# Patient Record
Sex: Female | Born: 1940
Health system: Southern US, Community
[De-identification: ages and names within clinical notes are randomized; demographics above are authoritative.]

## PROBLEM LIST (undated history)

## (undated) DIAGNOSIS — R112 Nausea with vomiting, unspecified: Secondary | ICD-10-CM

## (undated) DIAGNOSIS — I10 Essential (primary) hypertension: Secondary | ICD-10-CM

## (undated) DIAGNOSIS — Z9889 Other specified postprocedural states: Secondary | ICD-10-CM

## (undated) DIAGNOSIS — T4145XA Adverse effect of unspecified anesthetic, initial encounter: Secondary | ICD-10-CM

## (undated) DIAGNOSIS — T8859XA Other complications of anesthesia, initial encounter: Secondary | ICD-10-CM

## (undated) DIAGNOSIS — E669 Obesity, unspecified: Secondary | ICD-10-CM

## (undated) DIAGNOSIS — Z8041 Family history of malignant neoplasm of ovary: Secondary | ICD-10-CM

## (undated) DIAGNOSIS — E039 Hypothyroidism, unspecified: Secondary | ICD-10-CM

## (undated) DIAGNOSIS — Z803 Family history of malignant neoplasm of breast: Secondary | ICD-10-CM

## (undated) DIAGNOSIS — R002 Palpitations: Secondary | ICD-10-CM

## (undated) DIAGNOSIS — I679 Cerebrovascular disease, unspecified: Secondary | ICD-10-CM

## (undated) DIAGNOSIS — C801 Malignant (primary) neoplasm, unspecified: Secondary | ICD-10-CM

## (undated) DIAGNOSIS — K219 Gastro-esophageal reflux disease without esophagitis: Secondary | ICD-10-CM

## (undated) HISTORY — DX: Palpitations: R00.2

## (undated) HISTORY — DX: Essential (primary) hypertension: I10

## (undated) HISTORY — DX: Family history of malignant neoplasm of breast: Z80.3

## (undated) HISTORY — PX: ADENOIDECTOMY: SUR15

## (undated) HISTORY — DX: Family history of malignant neoplasm of ovary: Z80.41

## (undated) HISTORY — DX: Cerebrovascular disease, unspecified: I67.9

## (undated) HISTORY — PX: BLADDER SURGERY: SHX569

## (undated) HISTORY — PX: ROTATOR CUFF REPAIR: SHX139

## (undated) HISTORY — DX: Hypothyroidism, unspecified: E03.9

## (undated) HISTORY — PX: CHOLECYSTECTOMY: SHX55

## (undated) HISTORY — PX: TONSILLECTOMY: SUR1361

## (undated) HISTORY — DX: Obesity, unspecified: E66.9

---
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
## 1998-07-05 ENCOUNTER — Ambulatory Visit (HOSPITAL_BASED_OUTPATIENT_CLINIC_OR_DEPARTMENT_OTHER): Admission: RE | Admit: 1998-07-05 | Discharge: 1998-07-05 | Payer: Self-pay | Admitting: Orthopedic Surgery

## 1999-07-04 ENCOUNTER — Encounter: Admission: RE | Admit: 1999-07-04 | Discharge: 1999-07-04 | Payer: Self-pay | Admitting: *Deleted

## 1999-08-24 ENCOUNTER — Encounter: Payer: Self-pay | Admitting: Neurosurgery

## 1999-08-24 ENCOUNTER — Ambulatory Visit (HOSPITAL_COMMUNITY): Admission: RE | Admit: 1999-08-24 | Discharge: 1999-08-24 | Payer: Self-pay | Admitting: Neurosurgery

## 1999-08-27 ENCOUNTER — Encounter: Payer: Self-pay | Admitting: Neurosurgery

## 1999-08-27 ENCOUNTER — Ambulatory Visit (HOSPITAL_COMMUNITY): Admission: RE | Admit: 1999-08-27 | Discharge: 1999-08-27 | Payer: Self-pay | Admitting: Neurosurgery

## 1999-09-12 ENCOUNTER — Encounter: Admission: RE | Admit: 1999-09-12 | Discharge: 1999-09-12 | Payer: Self-pay | Admitting: *Deleted

## 1999-10-11 ENCOUNTER — Ambulatory Visit (HOSPITAL_COMMUNITY): Admission: RE | Admit: 1999-10-11 | Discharge: 1999-10-11 | Payer: Self-pay | Admitting: Neurology

## 1999-10-11 ENCOUNTER — Encounter: Payer: Self-pay | Admitting: Neurology

## 2000-03-01 ENCOUNTER — Ambulatory Visit (HOSPITAL_COMMUNITY): Admission: RE | Admit: 2000-03-01 | Discharge: 2000-03-01 | Payer: Self-pay | Admitting: *Deleted

## 2001-07-11 ENCOUNTER — Encounter: Payer: Self-pay | Admitting: Internal Medicine

## 2001-07-11 ENCOUNTER — Encounter: Admission: RE | Admit: 2001-07-11 | Discharge: 2001-07-11 | Payer: Self-pay | Admitting: Internal Medicine

## 2003-04-17 ENCOUNTER — Ambulatory Visit (HOSPITAL_COMMUNITY): Admission: RE | Admit: 2003-04-17 | Discharge: 2003-04-17 | Payer: Self-pay | Admitting: *Deleted

## 2003-04-17 ENCOUNTER — Encounter (INDEPENDENT_AMBULATORY_CARE_PROVIDER_SITE_OTHER): Payer: Self-pay | Admitting: Specialist

## 2003-04-20 ENCOUNTER — Ambulatory Visit (HOSPITAL_COMMUNITY): Admission: RE | Admit: 2003-04-20 | Discharge: 2003-04-20 | Payer: Self-pay | Admitting: Internal Medicine

## 2003-06-19 ENCOUNTER — Encounter (INDEPENDENT_AMBULATORY_CARE_PROVIDER_SITE_OTHER): Payer: Self-pay | Admitting: Specialist

## 2003-06-19 ENCOUNTER — Ambulatory Visit (HOSPITAL_COMMUNITY): Admission: RE | Admit: 2003-06-19 | Discharge: 2003-06-19 | Payer: Self-pay | Admitting: *Deleted

## 2004-01-28 ENCOUNTER — Ambulatory Visit (HOSPITAL_COMMUNITY): Admission: RE | Admit: 2004-01-28 | Discharge: 2004-01-28 | Payer: Self-pay | Admitting: Cardiology

## 2006-08-30 ENCOUNTER — Encounter: Admission: RE | Admit: 2006-08-30 | Discharge: 2006-08-30 | Payer: Self-pay | Admitting: Internal Medicine

## 2006-08-30 ENCOUNTER — Inpatient Hospital Stay (HOSPITAL_COMMUNITY): Admission: EM | Admit: 2006-08-30 | Discharge: 2006-09-02 | Payer: Self-pay | Admitting: *Deleted

## 2006-12-04 ENCOUNTER — Encounter (INDEPENDENT_AMBULATORY_CARE_PROVIDER_SITE_OTHER): Payer: Self-pay | Admitting: Plastic Surgery

## 2006-12-04 ENCOUNTER — Ambulatory Visit (HOSPITAL_BASED_OUTPATIENT_CLINIC_OR_DEPARTMENT_OTHER): Admission: RE | Admit: 2006-12-04 | Discharge: 2006-12-04 | Payer: Self-pay | Admitting: Plastic Surgery

## 2009-06-02 ENCOUNTER — Encounter: Payer: Self-pay | Admitting: Cardiovascular Disease

## 2010-01-18 ENCOUNTER — Encounter: Payer: Self-pay | Admitting: Cardiovascular Disease

## 2010-01-18 ENCOUNTER — Ambulatory Visit: Payer: Self-pay

## 2010-01-18 ENCOUNTER — Encounter: Admission: RE | Admit: 2010-01-18 | Discharge: 2010-01-18 | Payer: Self-pay | Admitting: Cardiology

## 2010-01-18 ENCOUNTER — Ambulatory Visit: Payer: Self-pay | Admitting: Cardiology

## 2010-01-18 DIAGNOSIS — R42 Dizziness and giddiness: Secondary | ICD-10-CM | POA: Insufficient documentation

## 2010-01-19 ENCOUNTER — Ambulatory Visit: Payer: Self-pay | Admitting: Cardiology

## 2010-01-19 ENCOUNTER — Ambulatory Visit (HOSPITAL_COMMUNITY): Admission: RE | Admit: 2010-01-19 | Discharge: 2010-01-19 | Payer: Self-pay | Admitting: Cardiology

## 2010-01-20 ENCOUNTER — Encounter: Admission: RE | Admit: 2010-01-20 | Discharge: 2010-01-20 | Payer: Self-pay | Admitting: Cardiology

## 2010-01-25 ENCOUNTER — Encounter: Payer: Self-pay | Admitting: Internal Medicine

## 2010-01-25 ENCOUNTER — Ambulatory Visit: Payer: Self-pay

## 2010-01-25 ENCOUNTER — Ambulatory Visit: Payer: Self-pay | Admitting: Internal Medicine

## 2010-01-25 ENCOUNTER — Encounter (HOSPITAL_COMMUNITY): Admission: RE | Admit: 2010-01-25 | Discharge: 2010-03-09 | Payer: Self-pay | Admitting: Cardiology

## 2010-01-28 ENCOUNTER — Ambulatory Visit: Payer: Self-pay | Admitting: Cardiology

## 2010-02-02 ENCOUNTER — Ambulatory Visit: Payer: Self-pay | Admitting: Cardiology

## 2010-06-07 ENCOUNTER — Encounter
Admission: RE | Admit: 2010-06-07 | Discharge: 2010-06-07 | Payer: Self-pay | Source: Home / Self Care | Attending: Internal Medicine | Admitting: Internal Medicine

## 2010-07-03 ENCOUNTER — Encounter: Payer: Self-pay | Admitting: Internal Medicine

## 2010-07-03 ENCOUNTER — Encounter: Payer: Self-pay | Admitting: Radiology

## 2010-07-14 NOTE — Miscellaneous (Signed)
Summary: Orders Update  Clinical Lists Changes  Problems: Added new problem of DIZZINESS (ICD-780.4) Orders: Added new Test order of Carotid Duplex (Carotid Duplex) - Signed 

## 2010-07-14 NOTE — Assessment & Plan Note (Signed)
Summary: Cardiology Nuclear Testing  Nuclear Med Background Indications for Stress Test: Evaluation for Ischemia, Abnormal EKG  Indications Comments: .  History: Myocardial Perfusion Study  History Comments:  ~'06 [id number] per patient  Symptoms: Chest Pressure with Exertion, Near Syncope, Palpitations, Rapid HR  Symptoms Comments: Last episode CP:2 weeks ago.    Nuclear Pre-Procedure Cardiac Risk Factors: Hypertension Caffeine/Decaff Intake: none NPO After: 7:00 PM Lungs: Clear IV 0.9% NS with Angio Cath: 22g     IV Site: R hand IV Started by: Darrick Penna Cup Size 38C     Height (in): 67 Weight (lb): 151 BMI: 23.74 Tech Comments: Atenolol held x 12 hours  Nuclear Med Study 1 or 2 day study:  1 day     Stress Test Type:  Stress Reading MD:  Arvilla Meres, MD     Referring MD:  Roger Shelter, MD Resting Radionuclide:  Technetium 53m Tetrofosmin     Resting Radionuclide Dose:  10 mCi  Stress Radionuclide:  Technetium 78m Tetrofosmin     Stress Radionuclide Dose:  33 mCi   Stress Protocol Exercise Time (min):  8:30 min     Max HR:  148 bpm     Predicted Max HR:  153 bpm  Max Systolic BP: 174 mm Hg     Percent Max HR:  96.73 %     METS: 10.2 Rate Pressure Product:  19147    Stress Test Technologist:  Rea [employer] CMA-N     Nuclear Technologist:  Domenic Polite CNMT  Rest Procedure  Myocardial perfusion imaging was performed at rest 45 minutes following the intravenous administration of Myoview Technetium 36m Tetrofosmin.  Stress Procedure  The patient exercised for 8:30.  The patient stopped due to fatigue.  She did c/o chest pressure, 2/10, with exercise.  There were no diagnostic ST-T wave changes.  Myoview was injected at peak exercise and myocardial perfusion imaging was performed after a brief delay.  QPS Raw Data Images:  There is interference from nuclear activity from structures below the diaphragm.  This does not affect the ability to read the  study. Stress Images:  There is normal uptake in all areas. Rest Images:  Normal homogeneous uptake in all areas of the myocardium. Subtraction (SDS):  Normal Transient Ischemic Dilatation:  1.02  (Normal <1.22)  Lung/Heart Ratio:  .27  (Normal <0.45)  Quantitative Gated Spect Images QGS EDV:  62 ml QGS ESV:  15 ml QGS EF:  76 % QGS cine images:  Normal  Findings Normal nuclear study      Overall Impression  Exercise Capacity: Good exercise capacity. BP Response: Normal blood pressure response. Clinical Symptoms: Chest pressure (2/10). Fatigue. ECG Impression: No significant ST segment change suggestive of ischemia. Overall Impression: Normal stress nuclear study.

## 2010-10-25 NOTE — Op Note (Signed)
Faith Mcmahon, Faith Mcmahon             ACCOUNT NO.:  [id number]   MEDICAL RECORD NO.:  [id number]          PATIENT TYPE:  AMB   LOCATION:  DSC                          FACILITY:  [facility]   PHYSICIAN:  Brantley Persons, M.D.DATE OF BIRTH:  May 03, 1941   DATE OF PROCEDURE:  12/04/2006  DATE OF DISCHARGE:  12/04/2006                               OPERATIVE REPORT   PREOPERATIVE DIAGNOSIS:  Basal cell cancer, right forehead.   POSTOPERATIVE DIAGNOSIS:  Basal cell cancer, right forehead.   PROCEDURE:  1. Wide local excision of 0.7-cm basal cell cancer, right forehead,      with intraoperative frozen section diagnosis.  2. Complex closure of 1.7-cm right forehead incision.   ATTENDING SURGEON:  Eloise Levels, M.D.   ANESTHESIA:  1% lidocaine with epinephrine.   COMPLICATIONS:  None.   INDICATIONS FOR PROCEDURE:  The patient is a 70 year old Caucasian  female who has a biopsy-proven basal cell cancer of the right forehead.  She has been referred by Dr. Campbell Stall for a wide local excision of  this lesion.  We will proceed with that in the minor room of the  surgical center and that have an intraoperative frozen section diagnosis  performed.  I will be on standby while we await the pathology report.   PROCEDURE:  The patient was brought to the minor surgery room and placed  on the table in supine position.  The right forehead and face were  prepped with Betadine and draped in a sterile fashion.  The skin and  subcutaneous tissues in the area of the healing biopsy site and residual  skin cancer were then injected with 1% lidocaine with epinephrine.  After adequate hemostasis and anesthesia had taken effect, the procedure  was begun.   Using loupe magnification, the borders of the healing biopsy site and  any residual basal cell cancer were identified.  Two-millimeter margins  were marked circumferentially around this.  The skin lesion was excised  full-thickness through the skin  into the subcutaneous tissues.  The  specimen marked at the 12 o'clock position with a silk suture and was  passed off the table to undergo an intraoperative frozen section  diagnosis.  While I was on standby, Dr. Berneta Levins, the pathologist,  reviewed the specimen and felt that all of the basal cell cancer had  been removed and that the surgical margins were clear.  The total length  of the excised specimen was 0.7 cm.  I then proceeded with closure of  the incision.  The skin edges and subcutaneous tissues were undermined  for better closure.  A complex closure then ensued.  The deep  subcutaneous tissues and dermal layer were closed with 4-0 Monocryl  suture.  The superficial dermal layer was then also closed with 4-0  Monocryl suture.  The skin was then closed with a 6-0  Prolene in a running baseball-type stitch.  There were no complications.  The patient tolerated the procedure well.  The incision was then dressed  with bacitracin ointment and the patient was given appropriate discharge  instructions.  She was then discharged  home in stable condition.  Followup appointment be tomorrow in the office.           ______________________________  Brantley Persons, M.D.     MC/MEDQ  D:  12/05/2006  T:  12/06/2006  Job:  [id number]

## 2010-10-28 NOTE — H&P (Signed)
Faith Mcmahon, Faith Mcmahon             ACCOUNT NO.:  [id number]   MEDICAL RECORD NO.:  [id number]          PATIENT TYPE:  INP   LOCATION:  5735                         FACILITY:  [facility]   PHYSICIAN:  Wilmon Arms. Corliss Skains, M.D. DATE OF BIRTH:  08/23/1940   DATE OF ADMISSION:  08/30/2006  DATE OF DISCHARGE:                              HISTORY & PHYSICAL   CHIEF COMPLAINT:  Left lower quadrant pain and nausea.   HISTORY OF PRESENT ILLNESS:  The patient is a 70 year old female in  relatively good health, who presents with a week of nausea and vomiting  accompanied by some diarrhea.  She was being treated with ciprofloxacin  on an outpatient basis for presumed diagnosis of food poisoning.  The  patient feels that the Cipro is causing a significant portion of her  nausea and has quit taking it.  Her vomiting has improved, but she still  has some nausea.  On August 29, 2006, she developed some left lower  quadrant abdominal pain.  She remains afebrile.  Overall, she feels very  poorly and went to see Dr. [name].  Apparently, he ordered a CT scan,  which showed some mild sigmoid diverticulitis, but there was a question  of dot of extraluminal air.  Dr. [name] told the patient to come to the  emergency department.  The patient denies any melena, hematochezia or  hematemesis.   PAST MEDICAL HISTORY:  1. Hypertension.  2. Peptic ulcer disease.   PAST SURGICAL HISTORY:  1. Laparoscopic cholecystectomy by Dr. Ezzard Standing.  2. Bilateral tubal ligation and hysterectomy.   ALLERGIES:  PENICILLIN and CIPRO, which the patient feels is causing  severe nausea.  The patient has no true allergy to CIPRO.   MEDICATIONS:  1. Atenolol/hydrochlorothiazide.  2. Premarin.  3. Prozac.  4. Multivitamin.  5. Caltrate.   SOCIAL HISTORY:  Nonsmoker, nondrinker.   PHYSICAL EXAMINATION:  VITAL SIGNS:  Temperature 97.5, pulse 78, blood  pressure 147/76, respirations 18.  GENERAL:  This is a well-developed,  well-nourished female in no apparent  distress.  HEENT:  EOMI.  Sclerae anicteric.  NECK:  No masses.  No thyromegaly.  LUNGS;  Clear.  Normal respiratory effort.  HEART:  Regular rate and rhythm.  No murmur.  ABDOMEN:  Soft.  Hypoactive bowel sounds.  The patient has some left  lower quadrant tenderness, no rebound, no guarding.   LABORATORY DATA:  White count 12.4, hemoglobin 14.2.  Electrolytes:  Sodium 133, potassium 4.2, chloride 101, bicarb 29.5, BUN 10, creatinine  0.74, total bilirubin 1.4, direct bilirubin 0.5.   IMPRESSION:  Sigmoid diverticulitis, first episode.   PLAN:  Bowel rest, IV hydration, IV antibiotics.  Due to her penicillin  allergy and her sensitivity to Cipro, we will use Tygacil.      Wilmon Arms. Tsuei, M.D.  Electronically Signed     MKT/MEDQ  D:  08/31/2006  T:  08/31/2006  Job:  [id number]

## 2010-10-28 NOTE — Discharge Summary (Signed)
NAMEKIONDRA, CAICEDO             ACCOUNT NO.:  [id number]   MEDICAL RECORD NO.:  [id number]          PATIENT TYPE:  INP   LOCATION:  5735                         FACILITY:  [facility]   PHYSICIAN:  Maisie Fus A. Cornett, M.D.DATE OF BIRTH:  06/15/1940   DATE OF ADMISSION:  08/30/2006  DATE OF DISCHARGE:  09/02/2006                               DISCHARGE SUMMARY   ADMITTING DIAGNOSIS:  Acute diverticulitis.   DISCHARGE DIAGNOSIS:  Acute diverticulitis.   PROCEDURES PERFORMED:  None.   BRIEF HISTORY:  The patient is a 70 year old female admitted on the 20th  with acute diverticulitis.  She was placed on bowel rest, IV fluids and  IV Tygacil.   HOSPITAL COURSE:  The patient's hospital course was unremarkable.  By  postop day one, her pain was almost completely gone.  I advanced her  diet to a soft diet by postop day two and then she was discharged postop  day three with no pain whatsoever, tolerating a soft diet and moving her  bowels.  Her white count was normal.  I switched her over from Tygacil  to clarithromycin 500 mg p.o. b.i.d. and Flagyl for seven more days.  SHE HAD A CIPRO AND PENICILLIN ALLERGY.   DISCHARGE INSTRUCTIONS:  She will follow up with her medical doctor in  ten days to two weeks.  She will go on atenolol 50 mg daily,  hydrochlorothiazide 12.5 mg daily, conjugated estrogen 0.625 mg daily,  clarithromycin 500 mg b.i.d. and Flagyl 500 mg b.i.d. and Prilosec over-  the-counter 20 mg daily.   CONDITION AT DISCHARGE:  Improved.      Thomas A. Cornett, M.D.  Electronically Signed     TAC/MEDQ  D:  09/02/2006  T:  09/02/2006  Job:  [id number]   cc:   Soyla Murphy. [name], M.D.

## 2010-10-28 NOTE — Op Note (Signed)
NAMEMarland Mcmahon  HARRIET, SUTPHEN                       ACCOUNT NO.:  [id number]   MEDICAL RECORD NO.:  [id number]                   PATIENT TYPE:  AMB   LOCATION:  ENDO                                 FACILITY:  [facility]   PHYSICIAN:  Georgiana Spinner, M.D.                 DATE OF BIRTH:  1940-07-12   DATE OF PROCEDURE:  DATE OF DISCHARGE:                                 OPERATIVE REPORT   PROCEDURE:  Upper endoscopy.   INDICATIONS:  Gastric ulcer.   ANESTHESIA:  Demerol 60 mg, Versed 8 mg.   DESCRIPTION OF PROCEDURE:  With the patient mildly sedated in the left  lateral decubitus position, the Olympus videoscopic endoscope was inserted  in the mouth and passed under direct vision through the esophagus, which  appeared normal except for one area of possible Barrett's which was seen,  photographed and biopsied.  We entered into the stomach.  The fundus, body,  antrum, duodenal bulb, and second portion of the duodenum all appeared  normal.  From this point, the endoscope was slowly withdrawn taking  circumferential views of the duodenal mucosa until the endoscope then pulled  back into the stomach, placed in retroflexion and viewed the stomach from  below.  The endoscope was then straightened, withdrawn, taking  circumferential views of the remaining gastric and esophageal mucosa.  The  patient's vital signs and pulse oximetry remained stable.  The patient  tolerated the procedure well without apparent complication.   FINDINGS:  Incomplete wrap of the GE junction around the endoscope with  question of Barrett's esophagus proximal.  Biopsy.  Await biopsy report.  Otherwise an unremarkable exam.  The patient will call me for results and  follow up with me as an outpatient.                                               Georgiana Spinner, M.D.    GMO/MEDQ  D:  06/19/2003  T:  06/19/2003  Job:  [id number]

## 2010-10-28 NOTE — Procedures (Signed)
[facility]  Patient:    Faith Mcmahon, Faith Mcmahon                    MRN: [id number] Proc. Date: 03/01/00 Adm. Date:  [id number] Attending:  Sabino Gasser                           Procedure Report  PROCEDURE:  Colonoscopy.  SURGEON:  Sabino Gasser, M.D.  ANESTHESIA:  Demerol 80 mg and Versed 8 mg.  INDICATIONS:  Family history of colon polyps.  DESCRIPTION OF PROCEDURE:  With the patient mildly sedated in the left lateral decubitus position, and subsequently with pressure applied to the left lower quadrant of the abdomen, the Olympus videoscopic pediatric colonoscope was inserted in the rectum and passed under direct vision to the cecum.  The cecum was identified by the ileocecal valve and appendiceal orifice, both of which were photographed.  From this point, the colonoscope was slowly withdrawn, taking circumferential views of the entire colonic mucosa, stopping only in the sigmoid colon which showed diverticula, which were photographed.  In the rectum, the endoscope was placed in retroflexion to view the anal canal from above.  An internal hemorrhoid was seen and photographed, both in this view and on a pull through after we straightened the endoscope.  The patients vital signs and pulse oximeter remained stable.  The patient tolerated the procedure well and without apparent complications.  FINDINGS:  Hemorrhoid and diverticuli of sigmoid colon, otherwise unremarkable examination.  PLAN:  Repeat examination in five years. DD:  03/01/00 TD:  03/02/00 Job: [id number]

## 2010-10-28 NOTE — Op Note (Signed)
   NAME:  Faith Mcmahon, Faith Mcmahon                       ACCOUNT NO.:  [id number]   MEDICAL RECORD NO.:  [id number]                   PATIENT TYPE:  AMB   LOCATION:  ENDO                                 FACILITY:  [facility]   PHYSICIAN:  Georgiana Spinner, M.D.                 DATE OF BIRTH:  03/16/1941   DATE OF PROCEDURE:  DATE OF DISCHARGE:                                 OPERATIVE REPORT   PROCEDURE:  Upper endoscopy with biopsy.   INDICATIONS FOR PROCEDURE:  Abdominal discomfort, nausea.   ANESTHESIA:  Demerol 70, Versed 8 mg.   DESCRIPTION OF PROCEDURE:  With the patient mildly sedated in the left  lateral decubitus position, the Olympus videoscopic endoscope was inserted  in the mouth, passed under direct vision through the esophagus which  appeared normal until we reached the distal esophagus and there was a  question of Barrett's photographed and biopsied. We entered into the stomach  and some blood flecks were seen in the stomach. We advanced through the  pylorus into the duodenal bulb and ulcerations were seen throughout the bulb  and also into the second portion of the duodenum small ulcerations were  seen. On the posterior wall of the duodenum, a larger ulcer with a black  spot was seen, none were actively bleeding. The endoscope was then pulled  back all the way to the stomach and biopsies of erosions in the stomach were  taken. The endoscope was placed in retroflexion to view the stomach from  below. The endoscope was straightened and withdrawn taking circumferential  views of the remaining gastric and esophageal mucosa. The patient's vital  signs and pulse oximeter remained stable. The patient tolerated the  procedure well without apparent complications.   FINDINGS:  Multiple ulcers of duodenum and of the stomach especially in the  antrum, await biopsy reports. The patient will call me for results and  followup with me as an outpatient. Will start the patient on PPI therapy,  get  a serum gastrin level and have the patient avoid NSAIDs.                                               Georgiana Spinner, M.D.    GMO/MEDQ  D:  04/17/2003  T:  04/17/2003  Job:  [id number]

## 2010-12-13 ENCOUNTER — Other Ambulatory Visit: Payer: Self-pay | Admitting: Internal Medicine

## 2010-12-13 DIAGNOSIS — R1011 Right upper quadrant pain: Secondary | ICD-10-CM

## 2010-12-13 DIAGNOSIS — R160 Hepatomegaly, not elsewhere classified: Secondary | ICD-10-CM

## 2010-12-15 ENCOUNTER — Ambulatory Visit
Admission: RE | Admit: 2010-12-15 | Discharge: 2010-12-15 | Disposition: A | Discharge status: Home / Self Care | Payer: Medicare Other | Source: Ambulatory Visit | Attending: Internal Medicine | Admitting: Internal Medicine

## 2010-12-15 DIAGNOSIS — R160 Hepatomegaly, not elsewhere classified: Secondary | ICD-10-CM

## 2010-12-15 DIAGNOSIS — R1011 Right upper quadrant pain: Secondary | ICD-10-CM

## 2010-12-15 MED ORDER — IOHEXOL 300 MG/ML  SOLN
100.0000 mL | Freq: Once | INTRAMUSCULAR | Status: AC | PRN
  Administered 2010-12-15: 100 mL via INTRAVENOUS

## 2011-01-11 ENCOUNTER — Encounter: Payer: Self-pay | Admitting: Cardiology

## 2011-01-25 ENCOUNTER — Encounter: Payer: Self-pay | Admitting: Cardiology

## 2011-06-26 ENCOUNTER — Other Ambulatory Visit: Payer: Self-pay | Admitting: Internal Medicine

## 2011-06-26 DIAGNOSIS — E041 Nontoxic single thyroid nodule: Secondary | ICD-10-CM

## 2011-06-28 ENCOUNTER — Ambulatory Visit
Admission: RE | Admit: 2011-06-28 | Discharge: 2011-06-28 | Disposition: A | Discharge status: Home / Self Care | Payer: Medicare Other | Source: Ambulatory Visit | Attending: Internal Medicine | Admitting: Internal Medicine

## 2011-06-28 DIAGNOSIS — E041 Nontoxic single thyroid nodule: Secondary | ICD-10-CM

## 2011-08-15 ENCOUNTER — Encounter: Payer: Self-pay | Admitting: Cardiology

## 2011-08-15 ENCOUNTER — Encounter: Payer: Self-pay | Admitting: *Deleted

## 2011-08-17 ENCOUNTER — Ambulatory Visit (INDEPENDENT_AMBULATORY_CARE_PROVIDER_SITE_OTHER): Payer: Medicare Other | Admitting: Cardiology

## 2011-08-17 ENCOUNTER — Encounter: Payer: Self-pay | Admitting: Cardiology

## 2011-08-17 DIAGNOSIS — I6529 Occlusion and stenosis of unspecified carotid artery: Secondary | ICD-10-CM

## 2011-08-17 DIAGNOSIS — I10 Essential (primary) hypertension: Secondary | ICD-10-CM | POA: Insufficient documentation

## 2011-08-17 DIAGNOSIS — I679 Cerebrovascular disease, unspecified: Secondary | ICD-10-CM

## 2011-08-17 DIAGNOSIS — E785 Hyperlipidemia, unspecified: Secondary | ICD-10-CM | POA: Insufficient documentation

## 2011-08-17 NOTE — Patient Instructions (Signed)
Your physician wants you to follow-up in: ONE YEAR You will receive a reminder letter in the mail two months in advance. If you don't receive a letter, please call our office to schedule the follow-up appointment.   Your physician has requested that you have a carotid duplex. This test is an ultrasound of the carotid arteries in your neck. It looks at blood flow through these arteries that supply the brain with blood. Allow one hour for this exam. There are no restrictions or special instructions.   

## 2011-08-17 NOTE — Assessment & Plan Note (Signed)
Continue statin. Lipids and liver monitored by primary care. 

## 2011-08-17 NOTE — Progress Notes (Signed)
HPI: 71 year old female previously followed by Dr. Deborah Chalk for fu of hypertension. Stress Myoview in January of 2011 showed normal perfusion and an ejection fraction of 76%. Echocardiogram in August of 2011 showed normal LV systolic function, mild left ventricular hypertrophy with diastolic dysfunction and mild tricuspid regurgitation. There was trace mitral regurgitation. Carotid Dopplers in December of 2010 showed severe stenosis in the left external carotid and mild to moderate stenosis in the right external carotid. The internal carotid arteries were normal. Previous holter monitor in August of 2011 showed no significant arrhythmia. MRA of the neck in August of 2011 showed no occlusions, stenosis, dissection or pseudoaneurysms. Patient states she did have followup carotids with her primary care which showed some progression of disease in her internal carotids. Patient last seen in August of 2011. Since then, there is no dyspnea, chest pain, palpitations, syncope.  Current Outpatient Prescriptions  Medication Sig Dispense Refill  . aspirin 81 MG tablet Take 81 mg by mouth daily.      . bisoprolol-hydrochlorothiazide (ZIAC) 5-6.25 MG per tablet 1/2 TAB PO QD      . calcium carbonate 200 MG capsule Take 250 mg by mouth 2 (two) times daily with a meal.      . cholecalciferol (VITAMIN D) 1000 UNITS tablet Take 1,000 Units by mouth daily.      . citalopram (CELEXA) 10 MG tablet Take 10 mg by mouth daily.      Marland Kitchen levothyroxine (SYNTHROID, LEVOTHROID) 50 MCG tablet SAT- SUN      . levothyroxine (SYNTHROID, LEVOTHROID) 75 MCG tablet MON- FRI      . Methylcellulose, Laxative, (CITRUCEL PO) Take 2 tablets by mouth daily.      . Multiple Vitamin (MULTIVITAMIN) capsule Take 1 capsule by mouth daily.      . Omega-3 Fatty Acids (FISH OIL PO) Take 1 tablet by mouth daily.      Marland Kitchen omeprazole (PRILOSEC) 20 MG capsule Take 20 mg by mouth daily.      . rosuvastatin (CRESTOR) 10 MG tablet Take 10 mg by mouth daily.       . vitamin B-12 (CYANOCOBALAMIN) 1000 MCG tablet Take 1,000 mcg by mouth daily.         Past Medical History  Diagnosis Date  . DIZZINESS 01/18/2010  . Hypertension   . Palpitations   . Obesity   . Hypothyroidism     Past Surgical History  Procedure Date  . Bladder surgery   . Rotator cuff repair   . Cholecystectomy   . Tonsillectomy   . Adenoidectomy     History   Social History  . Marital Status: Widowed    Spouse Name: N/A    Number of Children: N/A  . Years of Education: N/A   Occupational History  . Not on file.   Social History Main Topics  . Smoking status: Never Smoker   . Smokeless tobacco: Not on file  . Alcohol Use: Not on file  . Drug Use: Not on file  . Sexually Active: Not on file   Other Topics Concern  . Not on file   Social History Narrative  . No narrative on file    ROS: no fevers or chills, productive cough, hemoptysis, dysphasia, odynophagia, melena, hematochezia, dysuria, hematuria, rash, seizure activity, orthopnea, PND, pedal edema, claudication. Remaining systems are negative.  Physical Exam: Well-developed well-nourished in no acute distress.  Skin is warm and dry.  HEENT is normal.  Neck is supple. No thyromegaly. Bilateral carotid bruits  Chest is clear to auscultation with normal expansion.  Cardiovascular exam is regular rate and rhythm.  Abdominal exam nontender or distended. No masses palpated. Extremities show no edema. neuro grossly intact  ECG normal sinus rhythm at a rate of 64. Probable early repolarization abnormality. No change from previous.

## 2011-08-17 NOTE — Assessment & Plan Note (Signed)
Blood pressure controlled. Continue present medications. Potassium and renal function monitored by primary care. 

## 2011-08-17 NOTE — Assessment & Plan Note (Signed)
Continue aspirin and statin. Arrange followup carotid Dopplers. 

## 2011-09-01 ENCOUNTER — Encounter (INDEPENDENT_AMBULATORY_CARE_PROVIDER_SITE_OTHER): Payer: Medicare Other

## 2011-09-01 DIAGNOSIS — I6529 Occlusion and stenosis of unspecified carotid artery: Secondary | ICD-10-CM

## 2011-09-15 ENCOUNTER — Encounter: Payer: Self-pay | Admitting: Cardiology

## 2012-09-11 ENCOUNTER — Encounter (INDEPENDENT_AMBULATORY_CARE_PROVIDER_SITE_OTHER): Payer: Medicare Other

## 2012-09-11 DIAGNOSIS — I6529 Occlusion and stenosis of unspecified carotid artery: Secondary | ICD-10-CM

## 2012-10-06 ENCOUNTER — Ambulatory Visit (HOSPITAL_BASED_OUTPATIENT_CLINIC_OR_DEPARTMENT_OTHER): Payer: Medicare Other

## 2012-10-11 ENCOUNTER — Ambulatory Visit (INDEPENDENT_AMBULATORY_CARE_PROVIDER_SITE_OTHER): Payer: Medicare Other | Admitting: Cardiology

## 2012-10-11 ENCOUNTER — Encounter: Payer: Self-pay | Admitting: Cardiology

## 2012-10-11 VITALS — BP 126/84 | HR 65 | Wt 164.0 lb

## 2012-10-11 DIAGNOSIS — I679 Cerebrovascular disease, unspecified: Secondary | ICD-10-CM

## 2012-10-11 DIAGNOSIS — E785 Hyperlipidemia, unspecified: Secondary | ICD-10-CM

## 2012-10-11 DIAGNOSIS — I10 Essential (primary) hypertension: Secondary | ICD-10-CM

## 2012-10-11 NOTE — Patient Instructions (Addendum)
Your physician wants you to follow-up in: ONE YEAR WITH DR CRENSHAW You will receive a reminder letter in the mail two months in advance. If you don't receive a letter, please call our office to schedule the follow-up appointment.  

## 2012-10-11 NOTE — Assessment & Plan Note (Signed)
Continue statin. Lipids and liver monitored by primary care. 

## 2012-10-11 NOTE — Assessment & Plan Note (Signed)
Continue aspirin and statin. Plan repeat carotid Dopplers in March of 2016.

## 2012-10-11 NOTE — Assessment & Plan Note (Signed)
Blood pressure controlled. Continue present medications. 

## 2012-10-11 NOTE — Progress Notes (Signed)
HPI: Pleasant female for fu of hypertension. Stress Myoview in January of 2011 showed normal perfusion and an ejection fraction of 76%. Echocardiogram in August of 2011 showed normal LV systolic function, mild left ventricular hypertrophy with diastolic dysfunction and mild tricuspid regurgitation. There was trace mitral regurgitation. Previous holter monitor in August of 2011 showed no significant arrhythmia. MRA of the neck in August of 2011 showed no occlusions, stenosis, dissection or pseudoaneurysms. Carotid Dopplers in April of 2014 showed 0-39% bilateral stenosis; fu recommended in 2 years.  Patient last seen in March of 2013. Since then, there is no dyspnea, chest pain, palpitations, syncope. Occasional mild pedal edema.   Current Outpatient Prescriptions  Medication Sig Dispense Refill  . aspirin 81 MG tablet Take 81 mg by mouth daily.      . bisoprolol-hydrochlorothiazide (ZIAC) 5-6.25 MG per tablet 1/2 TAB PO QD      . calcium carbonate 200 MG capsule Take 250 mg by mouth 2 (two) times daily with a meal.      . cholecalciferol (VITAMIN D) 1000 UNITS tablet Take 1,000 Units by mouth daily.      . citalopram (CELEXA) 10 MG tablet Take 10 mg by mouth daily.      Marland Kitchen levothyroxine (SYNTHROID, LEVOTHROID) 50 MCG tablet SAT- SUN      . levothyroxine (SYNTHROID, LEVOTHROID) 75 MCG tablet MON- FRI      . Methylcellulose, Laxative, (CITRUCEL PO) Take 2 tablets by mouth daily.      . Multiple Vitamin (MULTIVITAMIN) capsule Take 1 capsule by mouth daily.      Marland Kitchen omeprazole (PRILOSEC) 20 MG capsule Take 20 mg by mouth daily.      . rosuvastatin (CRESTOR) 10 MG tablet Take 10 mg by mouth daily.      . vitamin B-12 (CYANOCOBALAMIN) 1000 MCG tablet Take 1,000 mcg by mouth daily.       No current facility-administered medications for this visit.     Past Medical History  Diagnosis Date  . DIZZINESS 01/18/2010  . Hypertension   . Palpitations   . Obesity   . Hypothyroidism     Past Surgical  History  Procedure Laterality Date  . Bladder surgery    . Rotator cuff repair    . Cholecystectomy    . Tonsillectomy    . Adenoidectomy      History   Social History  . Marital Status: Widowed    Spouse Name: N/A    Number of Children: N/A  . Years of Education: N/A   Occupational History  . Not on file.   Social History Main Topics  . Smoking status: Never Smoker   . Smokeless tobacco: Not on file  . Alcohol Use: Not on file  . Drug Use: Not on file  . Sexually Active: Not on file   Other Topics Concern  . Not on file   Social History Narrative  . No narrative on file    ROS: no fevers or chills, productive cough, hemoptysis, dysphasia, odynophagia, melena, hematochezia, dysuria, hematuria, rash, seizure activity, orthopnea, PND, pedal edema, claudication. Remaining systems are negative.  Physical Exam: Well-developed well-nourished in no acute distress.  Skin is warm and dry.  HEENT is normal.  Neck is supple.  Chest is clear to auscultation with normal expansion.  Cardiovascular exam is regular rate and rhythm.  Abdominal exam nontender or distended. No masses palpated. Extremities show no edema. neuro grossly intact  ECG normal sinus rhythm at a rate of 65.  Inferior lateral ST elevation.

## 2012-11-28 ENCOUNTER — Other Ambulatory Visit: Payer: Self-pay | Admitting: Dermatology

## 2013-10-16 ENCOUNTER — Ambulatory Visit (INDEPENDENT_AMBULATORY_CARE_PROVIDER_SITE_OTHER): Payer: Medicare Other | Admitting: Cardiology

## 2013-10-16 ENCOUNTER — Encounter: Payer: Self-pay | Admitting: Cardiology

## 2013-10-16 VITALS — BP 124/70 | HR 65 | Ht 67.0 in | Wt 164.0 lb

## 2013-10-16 DIAGNOSIS — I679 Cerebrovascular disease, unspecified: Secondary | ICD-10-CM

## 2013-10-16 DIAGNOSIS — I10 Essential (primary) hypertension: Secondary | ICD-10-CM

## 2013-10-16 DIAGNOSIS — E785 Hyperlipidemia, unspecified: Secondary | ICD-10-CM

## 2013-10-16 NOTE — Assessment & Plan Note (Signed)
Blood pressure controlled. Continue present medications. 

## 2013-10-16 NOTE — Patient Instructions (Signed)
Your physician wants you to follow-up in: ONE YEAR WITH DR CRENSHAW You will receive a reminder letter in the mail two months in advance. If you don't receive a letter, please call our office to schedule the follow-up appointment.  

## 2013-10-16 NOTE — Progress Notes (Signed)
      HPI: FU hypertension. Stress Myoview in January of 2011 showed normal perfusion and an ejection fraction of 76%. Echocardiogram in August of 2011 showed normal LV systolic function, mild left ventricular hypertrophy with diastolic dysfunction and mild tricuspid regurgitation. There was trace mitral regurgitation. Previous holter monitor in August of 2011 showed no significant arrhythmia. MRA of the neck in August of 2011 showed no occlusions, stenosis, dissection or pseudoaneurysms. Carotid Dopplers in April of 2014 showed 0-39% bilateral stenosis; fu recommended in 2 years. Patient last seen in May 2014. Since then, there is no dyspnea, chest pain, palpitations, syncope. Occasional mild pedal edema.   Current Outpatient Prescriptions  Medication Sig Dispense Refill  . aspirin 81 MG tablet Take 81 mg by mouth daily.      . bisoprolol-hydrochlorothiazide (ZIAC) 5-6.25 MG per tablet 1/2 TAB PO QD      . calcium carbonate 200 MG capsule Take 250 mg by mouth 2 (two) times daily with a meal.      . cholecalciferol (VITAMIN D) 1000 UNITS tablet Take 1,000 Units by mouth daily.      Marland Kitchen levothyroxine (SYNTHROID, LEVOTHROID) 50 MCG tablet SAT- SUN      . levothyroxine (SYNTHROID, LEVOTHROID) 75 MCG tablet MON- FRI      . Methylcellulose, Laxative, (CITRUCEL PO) Take 2 tablets by mouth daily.      . Multiple Vitamin (MULTIVITAMIN) capsule Take 1 capsule by mouth daily.      . pantoprazole (PROTONIX) 40 MG tablet Take 40 mg by mouth daily.      . rosuvastatin (CRESTOR) 10 MG tablet Take 10 mg by mouth daily.      . vitamin B-12 (CYANOCOBALAMIN) 1000 MCG tablet Take 1,000 mcg by mouth daily.       No current facility-administered medications for this visit.     Past Medical History  Diagnosis Date  . Hypertension   . Palpitations   . Obesity   . Hypothyroidism   . Cerebrovascular disease     Past Surgical History  Procedure Laterality Date  . Bladder surgery    . Rotator cuff repair      . Cholecystectomy    . Tonsillectomy    . Adenoidectomy      History   Social History  . Marital Status: Widowed    Spouse Name: N/A    Number of Children: N/A  . Years of Education: N/A   Occupational History  . Not on file.   Social History Main Topics  . Smoking status: Never Smoker   . Smokeless tobacco: Not on file  . Alcohol Use: Not on file  . Drug Use: Not on file  . Sexual Activity: Not on file   Other Topics Concern  . Not on file   Social History Narrative  . No narrative on file    ROS: no fevers or chills, productive cough, hemoptysis, dysphasia, odynophagia, melena, hematochezia, dysuria, hematuria, rash, seizure activity, orthopnea, PND, pedal edema, claudication. Remaining systems are negative.  Physical Exam: Well-developed well-nourished in no acute distress.  Skin is warm and dry.  HEENT is normal.  Neck is supple.  Chest is clear to auscultation with normal expansion.  Cardiovascular exam is regular rate and rhythm.  Abdominal exam nontender or distended. No masses palpated. Extremities show no edema. neuro grossly intact  ECG Sinus rhythm at a rate of 65. Diffuse mild ST elevation.

## 2013-10-16 NOTE — Assessment & Plan Note (Signed)
Continue statin. Lipids and liver monitored by primary care. 

## 2013-10-16 NOTE — Assessment & Plan Note (Signed)
Continue aspirin and statin. Follow-up carotid Dopplers April 2016. 

## 2014-07-30 DIAGNOSIS — D1801 Hemangioma of skin and subcutaneous tissue: Secondary | ICD-10-CM | POA: Diagnosis not present

## 2014-07-30 DIAGNOSIS — L821 Other seborrheic keratosis: Secondary | ICD-10-CM | POA: Diagnosis not present

## 2014-07-30 DIAGNOSIS — Z85828 Personal history of other malignant neoplasm of skin: Secondary | ICD-10-CM | POA: Diagnosis not present

## 2014-08-13 DIAGNOSIS — E780 Pure hypercholesterolemia: Secondary | ICD-10-CM | POA: Diagnosis not present

## 2014-08-13 DIAGNOSIS — M810 Age-related osteoporosis without current pathological fracture: Secondary | ICD-10-CM | POA: Diagnosis not present

## 2014-08-13 DIAGNOSIS — I10 Essential (primary) hypertension: Secondary | ICD-10-CM | POA: Diagnosis not present

## 2014-08-13 DIAGNOSIS — N390 Urinary tract infection, site not specified: Secondary | ICD-10-CM | POA: Diagnosis not present

## 2014-08-13 DIAGNOSIS — Z0000 Encounter for general adult medical examination without abnormal findings: Secondary | ICD-10-CM | POA: Diagnosis not present

## 2014-08-13 DIAGNOSIS — K219 Gastro-esophageal reflux disease without esophagitis: Secondary | ICD-10-CM | POA: Diagnosis not present

## 2014-08-13 DIAGNOSIS — E039 Hypothyroidism, unspecified: Secondary | ICD-10-CM | POA: Diagnosis not present

## 2014-08-18 DIAGNOSIS — Z0000 Encounter for general adult medical examination without abnormal findings: Secondary | ICD-10-CM | POA: Diagnosis not present

## 2014-08-18 DIAGNOSIS — E780 Pure hypercholesterolemia: Secondary | ICD-10-CM | POA: Diagnosis not present

## 2014-08-18 DIAGNOSIS — I349 Nonrheumatic mitral valve disorder, unspecified: Secondary | ICD-10-CM | POA: Diagnosis not present

## 2014-08-18 DIAGNOSIS — R7309 Other abnormal glucose: Secondary | ICD-10-CM | POA: Diagnosis not present

## 2014-08-31 DIAGNOSIS — H2513 Age-related nuclear cataract, bilateral: Secondary | ICD-10-CM | POA: Diagnosis not present

## 2014-09-28 ENCOUNTER — Other Ambulatory Visit (HOSPITAL_COMMUNITY): Payer: Self-pay | Admitting: Cardiology

## 2014-09-28 DIAGNOSIS — I6523 Occlusion and stenosis of bilateral carotid arteries: Secondary | ICD-10-CM

## 2014-10-01 ENCOUNTER — Ambulatory Visit (HOSPITAL_COMMUNITY): Payer: Medicare Other | Attending: Cardiology | Admitting: Cardiology

## 2014-10-01 DIAGNOSIS — I6523 Occlusion and stenosis of bilateral carotid arteries: Secondary | ICD-10-CM | POA: Diagnosis not present

## 2014-10-01 DIAGNOSIS — E785 Hyperlipidemia, unspecified: Secondary | ICD-10-CM | POA: Diagnosis not present

## 2014-10-01 DIAGNOSIS — I10 Essential (primary) hypertension: Secondary | ICD-10-CM | POA: Diagnosis not present

## 2014-10-01 NOTE — Progress Notes (Signed)
Carotid duplex performed 

## 2014-10-06 DIAGNOSIS — H8110 Benign paroxysmal vertigo, unspecified ear: Secondary | ICD-10-CM | POA: Diagnosis not present

## 2014-10-06 DIAGNOSIS — G250 Essential tremor: Secondary | ICD-10-CM | POA: Diagnosis not present

## 2014-10-06 DIAGNOSIS — I10 Essential (primary) hypertension: Secondary | ICD-10-CM | POA: Diagnosis not present

## 2014-10-09 DIAGNOSIS — Z1231 Encounter for screening mammogram for malignant neoplasm of breast: Secondary | ICD-10-CM | POA: Diagnosis not present

## 2014-10-09 DIAGNOSIS — Z803 Family history of malignant neoplasm of breast: Secondary | ICD-10-CM | POA: Diagnosis not present

## 2014-11-02 NOTE — Progress Notes (Signed)
      HPI: FU hypertension. Stress Myoview in January of 2011 showed normal perfusion and an ejection fraction of 76%. Echocardiogram in August of 2011 showed normal LV systolic function, mild left ventricular hypertrophy with diastolic dysfunction and mild tricuspid regurgitation. There was trace mitral regurgitation. Previous holter monitor in August of 2011 showed no significant arrhythmia. MRA of the neck in August of 2011 showed no occlusions, stenosis, dissection or pseudoaneurysms. Carotid Dopplers in April of 2016 showed 1-39% bilateral stenosis; fu recommended in 2 years. Since last seen, she denies chest pain, dyspnea or syncope. She did have brief palpitations approximately one month ago.  Current Outpatient Prescriptions  Medication Sig Dispense Refill  . aspirin 81 MG tablet Take 81 mg by mouth daily.    . bisoprolol-hydrochlorothiazide (ZIAC) 5-6.25 MG per tablet 1/2 TAB PO QD    . calcium carbonate 200 MG capsule Take 250 mg by mouth 2 (two) times daily with a meal.    . cholecalciferol (VITAMIN D) 1000 UNITS tablet Take 1,000 Units by mouth daily.    Marland Kitchen levothyroxine (SYNTHROID, LEVOTHROID) 50 MCG tablet SAT- SUN    . levothyroxine (SYNTHROID, LEVOTHROID) 75 MCG tablet MON- FRI    . Methylcellulose, Laxative, (CITRUCEL PO) Take 2 tablets by mouth daily.    . Multiple Vitamin (MULTIVITAMIN) capsule Take 1 capsule by mouth daily.    Marland Kitchen omeprazole (PRILOSEC) 20 MG capsule Take 20 mg by mouth daily.    . rosuvastatin (CRESTOR) 10 MG tablet Take 10 mg by mouth daily.    Marland Kitchen triamterene-hydrochlorothiazide (MAXZIDE-25) 37.5-25 MG per tablet as needed.  0  . vitamin B-12 (CYANOCOBALAMIN) 1000 MCG tablet Take 1,000 mcg by mouth daily.     No current facility-administered medications for this visit.     Past Medical History  Diagnosis Date  . Hypertension   . Palpitations   . Obesity   . Hypothyroidism   . Cerebrovascular disease     Past Surgical History  Procedure Laterality  Date  . Bladder surgery    . Rotator cuff repair    . Cholecystectomy    . Tonsillectomy    . Adenoidectomy      History   Social History  . Marital Status: Widowed    Spouse Name: N/A  . Number of Children: N/A  . Years of Education: N/A   Occupational History  . Not on file.   Social History Main Topics  . Smoking status: Never Smoker   . Smokeless tobacco: Not on file  . Alcohol Use: Not on file  . Drug Use: Not on file  . Sexual Activity: Not on file   Other Topics Concern  . Not on file   Social History Narrative    ROS: no fevers or chills, productive cough, hemoptysis, dysphasia, odynophagia, melena, hematochezia, dysuria, hematuria, rash, seizure activity, orthopnea, PND, pedal edema, claudication. Remaining systems are negative.  Physical Exam: Well-developed well-nourished in no acute distress.  Skin is warm and dry.  HEENT is normal.  Neck is supple.  Chest is clear to auscultation with normal expansion.  Cardiovascular exam is regular rate and rhythm.  Abdominal exam nontender or distended. No masses palpated. Extremities show no edema. neuro grossly intact  ECG sinus rhythm at a rate of 60. Nonspecific ST changes.

## 2014-11-03 ENCOUNTER — Encounter: Payer: Self-pay | Admitting: Cardiology

## 2014-11-03 ENCOUNTER — Ambulatory Visit (INDEPENDENT_AMBULATORY_CARE_PROVIDER_SITE_OTHER): Payer: Medicare Other | Admitting: Cardiology

## 2014-11-03 VITALS — BP 118/70 | HR 60 | Ht 67.0 in | Wt 167.0 lb

## 2014-11-03 DIAGNOSIS — I10 Essential (primary) hypertension: Secondary | ICD-10-CM

## 2014-11-03 DIAGNOSIS — I679 Cerebrovascular disease, unspecified: Secondary | ICD-10-CM

## 2014-11-03 DIAGNOSIS — E785 Hyperlipidemia, unspecified: Secondary | ICD-10-CM

## 2014-11-03 NOTE — Assessment & Plan Note (Signed)
Blood pressure controlled. Continue present medications. Potassium and renal function monitored by primary care. 

## 2014-11-03 NOTE — Assessment & Plan Note (Signed)
Continue aspirin and statin. Follow-up carotid Dopplers April 2018.

## 2014-11-03 NOTE — Patient Instructions (Signed)
Your physician recommends that you schedule a follow-up appointment in: ONE YEAR 

## 2014-11-03 NOTE — Assessment & Plan Note (Signed)
Continue statin. Lipids and liver monitored by primary care. 

## 2014-11-10 ENCOUNTER — Ambulatory Visit: Payer: Self-pay | Admitting: Cardiology

## 2015-03-26 DIAGNOSIS — Z23 Encounter for immunization: Secondary | ICD-10-CM | POA: Diagnosis not present

## 2015-04-15 DIAGNOSIS — L03115 Cellulitis of right lower limb: Secondary | ICD-10-CM | POA: Diagnosis not present

## 2015-04-23 DIAGNOSIS — L03115 Cellulitis of right lower limb: Secondary | ICD-10-CM | POA: Diagnosis not present

## 2015-08-05 DIAGNOSIS — L728 Other follicular cysts of the skin and subcutaneous tissue: Secondary | ICD-10-CM | POA: Diagnosis not present

## 2015-08-05 DIAGNOSIS — Z85828 Personal history of other malignant neoplasm of skin: Secondary | ICD-10-CM | POA: Diagnosis not present

## 2015-08-05 DIAGNOSIS — L821 Other seborrheic keratosis: Secondary | ICD-10-CM | POA: Diagnosis not present

## 2015-08-05 DIAGNOSIS — D1801 Hemangioma of skin and subcutaneous tissue: Secondary | ICD-10-CM | POA: Diagnosis not present

## 2015-08-13 DIAGNOSIS — E039 Hypothyroidism, unspecified: Secondary | ICD-10-CM | POA: Diagnosis not present

## 2015-08-13 DIAGNOSIS — Z0000 Encounter for general adult medical examination without abnormal findings: Secondary | ICD-10-CM | POA: Diagnosis not present

## 2015-08-13 DIAGNOSIS — E7800 Pure hypercholesterolemia, unspecified: Secondary | ICD-10-CM | POA: Diagnosis not present

## 2015-08-13 DIAGNOSIS — M810 Age-related osteoporosis without current pathological fracture: Secondary | ICD-10-CM | POA: Diagnosis not present

## 2015-08-13 DIAGNOSIS — Z7689 Persons encountering health services in other specified circumstances: Secondary | ICD-10-CM | POA: Diagnosis not present

## 2015-08-13 DIAGNOSIS — I10 Essential (primary) hypertension: Secondary | ICD-10-CM | POA: Diagnosis not present

## 2015-08-17 DIAGNOSIS — Z0000 Encounter for general adult medical examination without abnormal findings: Secondary | ICD-10-CM | POA: Diagnosis not present

## 2015-09-15 DIAGNOSIS — R300 Dysuria: Secondary | ICD-10-CM | POA: Diagnosis not present

## 2015-09-29 DIAGNOSIS — Z1231 Encounter for screening mammogram for malignant neoplasm of breast: Secondary | ICD-10-CM | POA: Diagnosis not present

## 2015-10-20 NOTE — Progress Notes (Signed)
      HPI: FU hypertension. Stress Myoview in January of 2011 showed normal perfusion and an ejection fraction of 76%. Echocardiogram in August of 2011 showed normal LV systolic function, mild left ventricular hypertrophy with diastolic dysfunction and mild tricuspid regurgitation. There was trace mitral regurgitation. Previous holter monitor in August of 2011 showed no significant arrhythmia. MRA of the neck in August of 2011 showed no occlusions, stenosis, dissection or pseudoaneurysms. Carotid Dopplers in April of 2016 showed 1-39% bilateral stenosis; fu recommended in 2 years. Since last seen, She denies dyspnea, chest pain, palpitations or syncope. She does have some dizziness with standing at times and her blood pressure runs low.  Current Outpatient Prescriptions  Medication Sig Dispense Refill  . aspirin 81 MG tablet Take 81 mg by mouth daily.    . calcium carbonate 200 MG capsule Take 250 mg by mouth 2 (two) times daily with a meal.    . cetirizine (ZYRTEC) 10 MG tablet Take 10 mg by mouth daily.    . cholecalciferol (VITAMIN D) 1000 UNITS tablet Take 1,000 Units by mouth daily.    Marland Kitchen levothyroxine (SYNTHROID, LEVOTHROID) 50 MCG tablet SAT- SUN    . levothyroxine (SYNTHROID, LEVOTHROID) 75 MCG tablet MON- FRI    . Methylcellulose, Laxative, (CITRUCEL PO) Take 2 tablets by mouth daily.    . Multiple Vitamin (MULTIVITAMIN) capsule Take 1 capsule by mouth daily.    Marland Kitchen omeprazole (PRILOSEC) 20 MG capsule Take 20 mg by mouth daily.    . rosuvastatin (CRESTOR) 10 MG tablet Take 10 mg by mouth daily.    Marland Kitchen triamterene-hydrochlorothiazide (MAXZIDE-25) 37.5-25 MG per tablet as needed.  0  . vitamin B-12 (CYANOCOBALAMIN) 1000 MCG tablet Take 1,000 mcg by mouth daily.     No current facility-administered medications for this visit.     Past Medical History  Diagnosis Date  . Hypertension   . Palpitations   . Obesity   . Hypothyroidism   . Cerebrovascular disease     Past Surgical History   Procedure Laterality Date  . Bladder surgery    . Rotator cuff repair    . Cholecystectomy    . Tonsillectomy    . Adenoidectomy      Social History   Social History  . Marital Status: Widowed    Spouse Name: N/A  . Number of Children: N/A  . Years of Education: N/A   Occupational History  . Not on file.   Social History Main Topics  . Smoking status: Never Smoker   . Smokeless tobacco: Not on file  . Alcohol Use: Not on file  . Drug Use: Not on file  . Sexual Activity: Not on file   Other Topics Concern  . Not on file   Social History Narrative    Family History  Problem Relation Age of Onset  . Heart failure      ROS: no fevers or chills, productive cough, hemoptysis, dysphasia, odynophagia, melena, hematochezia, dysuria, hematuria, rash, seizure activity, orthopnea, PND, pedal edema, claudication. Remaining systems are negative.  Physical Exam: Well-developed well-nourished in no acute distress.  Skin is warm and dry.  HEENT is normal.  Neck is supple.  Chest is clear to auscultation with normal expansion.  Cardiovascular exam is regular rate and rhythm.  Abdominal exam nontender or distended. No masses palpated. Extremities show no edema. neuro grossly intact  ECG Sinus rhythm with probable early repolarization abnormality.

## 2015-10-21 DIAGNOSIS — H2513 Age-related nuclear cataract, bilateral: Secondary | ICD-10-CM | POA: Diagnosis not present

## 2015-10-21 DIAGNOSIS — Z0100 Encounter for examination of eyes and vision without abnormal findings: Secondary | ICD-10-CM | POA: Diagnosis not present

## 2015-10-21 DIAGNOSIS — H25013 Cortical age-related cataract, bilateral: Secondary | ICD-10-CM | POA: Diagnosis not present

## 2015-10-28 ENCOUNTER — Ambulatory Visit (INDEPENDENT_AMBULATORY_CARE_PROVIDER_SITE_OTHER): Payer: Medicare Other | Admitting: Cardiology

## 2015-10-28 ENCOUNTER — Encounter: Payer: Self-pay | Admitting: Cardiology

## 2015-10-28 VITALS — BP 128/68 | HR 63 | Ht 67.0 in | Wt 168.0 lb

## 2015-10-28 DIAGNOSIS — I10 Essential (primary) hypertension: Secondary | ICD-10-CM

## 2015-10-28 DIAGNOSIS — E785 Hyperlipidemia, unspecified: Secondary | ICD-10-CM | POA: Diagnosis not present

## 2015-10-28 DIAGNOSIS — I679 Cerebrovascular disease, unspecified: Secondary | ICD-10-CM

## 2015-10-28 MED ORDER — BISOPROLOL FUMARATE 5 MG PO TABS: 5.0000 mg | ORAL_TABLET | Freq: Every day | ORAL | Status: DC

## 2015-10-28 NOTE — Assessment & Plan Note (Signed)
Continue statin. 

## 2015-10-28 NOTE — Patient Instructions (Signed)
Medication Instructions:   START BISOPROLOL HCTZ  START BISOPROLOL 5 MG ONCE DAILY  Follow-Up:  Your physician wants you to follow-up in: [place] will receive a reminder letter in the mail two months in advance. If you don't receive a letter, please call our office to schedule the follow-up appointment.   If you need a refill on your cardiac medications before your next appointment, please call your pharmacy.

## 2015-10-28 NOTE — Assessment & Plan Note (Signed)
Patient is having some orthostatic symptoms. We will discontinue hydrochlorothiazide and continue bisoprolol. Follow blood pressure and adjust regimen as needed.

## 2015-10-28 NOTE — Assessment & Plan Note (Signed)
Continue aspirin and statin. Follow-up carotid Dopplers April 2018. 

## 2015-11-04 ENCOUNTER — Ambulatory Visit: Payer: Medicare Other | Admitting: Cardiology

## 2015-11-10 DIAGNOSIS — R3121 Asymptomatic microscopic hematuria: Secondary | ICD-10-CM | POA: Diagnosis not present

## 2015-11-10 DIAGNOSIS — Z0000 Encounter for general adult medical examination without abnormal findings: Secondary | ICD-10-CM | POA: Diagnosis not present

## 2015-11-10 DIAGNOSIS — R3911 Hesitancy of micturition: Secondary | ICD-10-CM | POA: Diagnosis not present

## 2015-11-10 DIAGNOSIS — Z87440 Personal history of urinary (tract) infections: Secondary | ICD-10-CM | POA: Diagnosis not present

## 2015-11-10 DIAGNOSIS — N952 Postmenopausal atrophic vaginitis: Secondary | ICD-10-CM | POA: Diagnosis not present

## 2015-12-29 DIAGNOSIS — M25539 Pain in unspecified wrist: Secondary | ICD-10-CM | POA: Diagnosis not present

## 2015-12-29 DIAGNOSIS — M19042 Primary osteoarthritis, left hand: Secondary | ICD-10-CM | POA: Diagnosis not present

## 2016-02-22 DIAGNOSIS — M25572 Pain in left ankle and joints of left foot: Secondary | ICD-10-CM | POA: Diagnosis not present

## 2016-02-28 DIAGNOSIS — I10 Essential (primary) hypertension: Secondary | ICD-10-CM | POA: Diagnosis not present

## 2016-02-28 DIAGNOSIS — Z86010 Personal history of colonic polyps: Secondary | ICD-10-CM | POA: Diagnosis not present

## 2016-02-28 DIAGNOSIS — K219 Gastro-esophageal reflux disease without esophagitis: Secondary | ICD-10-CM | POA: Diagnosis not present

## 2016-03-07 DIAGNOSIS — M25571 Pain in right ankle and joints of right foot: Secondary | ICD-10-CM | POA: Diagnosis not present

## 2016-03-21 DIAGNOSIS — D124 Benign neoplasm of descending colon: Secondary | ICD-10-CM | POA: Diagnosis not present

## 2016-03-21 DIAGNOSIS — D123 Benign neoplasm of transverse colon: Secondary | ICD-10-CM | POA: Diagnosis not present

## 2016-03-21 DIAGNOSIS — K5730 Diverticulosis of large intestine without perforation or abscess without bleeding: Secondary | ICD-10-CM | POA: Diagnosis not present

## 2016-03-21 DIAGNOSIS — Z86010 Personal history of colonic polyps: Secondary | ICD-10-CM | POA: Diagnosis not present

## 2016-03-21 DIAGNOSIS — Z1211 Encounter for screening for malignant neoplasm of colon: Secondary | ICD-10-CM | POA: Diagnosis not present

## 2016-03-21 DIAGNOSIS — K635 Polyp of colon: Secondary | ICD-10-CM | POA: Diagnosis not present

## 2016-03-24 DIAGNOSIS — Z23 Encounter for immunization: Secondary | ICD-10-CM | POA: Diagnosis not present

## 2016-06-21 DIAGNOSIS — R0781 Pleurodynia: Secondary | ICD-10-CM | POA: Diagnosis not present

## 2016-08-10 DIAGNOSIS — D485 Neoplasm of uncertain behavior of skin: Secondary | ICD-10-CM | POA: Diagnosis not present

## 2016-08-10 DIAGNOSIS — L814 Other melanin hyperpigmentation: Secondary | ICD-10-CM | POA: Diagnosis not present

## 2016-08-10 DIAGNOSIS — C44519 Basal cell carcinoma of skin of other part of trunk: Secondary | ICD-10-CM | POA: Diagnosis not present

## 2016-08-10 DIAGNOSIS — L821 Other seborrheic keratosis: Secondary | ICD-10-CM | POA: Diagnosis not present

## 2016-08-10 DIAGNOSIS — D1801 Hemangioma of skin and subcutaneous tissue: Secondary | ICD-10-CM | POA: Diagnosis not present

## 2016-08-10 DIAGNOSIS — L570 Actinic keratosis: Secondary | ICD-10-CM | POA: Diagnosis not present

## 2016-08-10 DIAGNOSIS — Z85828 Personal history of other malignant neoplasm of skin: Secondary | ICD-10-CM | POA: Diagnosis not present

## 2016-08-25 DIAGNOSIS — M8580 Other specified disorders of bone density and structure, unspecified site: Secondary | ICD-10-CM | POA: Diagnosis not present

## 2016-08-25 DIAGNOSIS — N390 Urinary tract infection, site not specified: Secondary | ICD-10-CM | POA: Diagnosis not present

## 2016-08-25 DIAGNOSIS — I10 Essential (primary) hypertension: Secondary | ICD-10-CM | POA: Diagnosis not present

## 2016-08-25 DIAGNOSIS — E559 Vitamin D deficiency, unspecified: Secondary | ICD-10-CM | POA: Diagnosis not present

## 2016-08-25 DIAGNOSIS — M810 Age-related osteoporosis without current pathological fracture: Secondary | ICD-10-CM | POA: Diagnosis not present

## 2016-08-25 DIAGNOSIS — Z0000 Encounter for general adult medical examination without abnormal findings: Secondary | ICD-10-CM | POA: Diagnosis not present

## 2016-08-25 DIAGNOSIS — E7800 Pure hypercholesterolemia, unspecified: Secondary | ICD-10-CM | POA: Diagnosis not present

## 2016-09-18 DIAGNOSIS — E039 Hypothyroidism, unspecified: Secondary | ICD-10-CM | POA: Diagnosis not present

## 2016-09-18 DIAGNOSIS — R7301 Impaired fasting glucose: Secondary | ICD-10-CM | POA: Diagnosis not present

## 2016-09-18 DIAGNOSIS — Z0000 Encounter for general adult medical examination without abnormal findings: Secondary | ICD-10-CM | POA: Diagnosis not present

## 2016-09-18 DIAGNOSIS — E7800 Pure hypercholesterolemia, unspecified: Secondary | ICD-10-CM | POA: Diagnosis not present

## 2016-09-26 DIAGNOSIS — C44519 Basal cell carcinoma of skin of other part of trunk: Secondary | ICD-10-CM | POA: Diagnosis not present

## 2016-09-27 DIAGNOSIS — Z1231 Encounter for screening mammogram for malignant neoplasm of breast: Secondary | ICD-10-CM | POA: Diagnosis not present

## 2016-09-28 DIAGNOSIS — N390 Urinary tract infection, site not specified: Secondary | ICD-10-CM | POA: Diagnosis not present

## 2016-09-28 DIAGNOSIS — R300 Dysuria: Secondary | ICD-10-CM | POA: Diagnosis not present

## 2016-10-16 ENCOUNTER — Encounter: Payer: Self-pay | Admitting: Cardiology

## 2016-10-17 NOTE — Progress Notes (Signed)
HPI: FU hypertension. Stress Myoview in January of 2011 showed normal perfusion and an ejection fraction of 76%. Echocardiogram in August of 2011 showed normal LV systolic function, mild left ventricular hypertrophy with diastolic dysfunction and mild tricuspid regurgitation. There was trace mitral regurgitation. Previous holter monitor in August of 2011 showed no significant arrhythmia. MRA of the neck in August of 2011 showed no occlusions, stenosis, dissection or pseudoaneurysms. Carotid Dopplers in April of 2016 showed 1-39% bilateral stenosis; fu recommended in 2 years. Since last seen, she has some chest tightness when completing walking up a steep hill but otherwise no chest pain. No dyspnea on exertion, orthopnea, PND or syncope.  Current Outpatient Prescriptions  Medication Sig Dispense Refill  . aspirin 81 MG tablet Take 81 mg by mouth daily.    . bisoprolol (ZEBETA) 5 MG tablet Take 1 tablet (5 mg total) by mouth daily. (Patient taking differently: Take 2.5 mg by mouth daily. ) 90 tablet 3  . calcium carbonate 200 MG capsule Take 250 mg by mouth 2 (two) times daily with a meal.    . cetirizine (ZYRTEC) 10 MG tablet Take 10 mg by mouth daily.    . cholecalciferol (VITAMIN D) 1000 UNITS tablet Take 1,000 Units by mouth daily.    Marland Kitchen levothyroxine (SYNTHROID, LEVOTHROID) 50 MCG tablet SAT- SUN    . levothyroxine (SYNTHROID, LEVOTHROID) 75 MCG tablet MON- FRI    . Methylcellulose, Laxative, (CITRUCEL PO) Take 2 tablets by mouth daily.    . Multiple Vitamin (MULTIVITAMIN) capsule Take 1 capsule by mouth daily.    Marland Kitchen omeprazole (PRILOSEC) 20 MG capsule Take 20 mg by mouth daily.    . rosuvastatin (CRESTOR) 10 MG tablet Take 10 mg by mouth daily.    Marland Kitchen triamterene-hydrochlorothiazide (MAXZIDE-25) 37.5-25 MG per tablet as needed.  0  . vitamin B-12 (CYANOCOBALAMIN) 1000 MCG tablet Take 1,000 mcg by mouth daily.     No current facility-administered medications for this visit.      Past  Medical History:  Diagnosis Date  . Cerebrovascular disease   . Hypertension   . Hypothyroidism   . Obesity   . Palpitations     Past Surgical History:  Procedure Laterality Date  . ADENOIDECTOMY    . BLADDER SURGERY    . CHOLECYSTECTOMY    . ROTATOR CUFF REPAIR    . TONSILLECTOMY      Social History   Social History  . Marital status: Widowed    Spouse name: N/A  . Number of children: N/A  . Years of education: N/A   Occupational History  . Not on file.   Social History Main Topics  . Smoking status: Never Smoker  . Smokeless tobacco: Never Used  . Alcohol use No  . Drug use: No  . Sexual activity: Not on file   Other Topics Concern  . Not on file   Social History Narrative  . No narrative on file    Family History  Problem Relation Age of Onset  . Heart failure Unknown     ROS: no fevers or chills, productive cough, hemoptysis, dysphasia, odynophagia, melena, hematochezia, dysuria, hematuria, rash, seizure activity, orthopnea, PND, pedal edema, claudication. Remaining systems are negative.  Physical Exam: Well-developed well-nourished in no acute distress.  Skin is warm and dry.  HEENT is normal.  Neck is supple. No bruits. Chest is clear to auscultation with normal expansion. No wheeze. Cardiovascular exam is regular rate and rhythm.  Abdominal exam nontender  or distended. No masses palpated. Extremities show no edema. neuro grossly intact  ECG- Sinus rhythm at a rate of 64. Probable early repolarization abnormality. personally reviewed  A/P  1 Hypertension-blood pressure is controlled. Continue present medications. Labs from 3/18 personally reviewed; normal K, BUN and creatinine.  2 carotid artery disease-schedule follow-up carotid Dopplers.  3 hyperlipidemia-continue statin. Laboratories from 3/18 reviewed personally reviewed and showed LDL less than 70 and normal liver functions.   4 chest tightness-some chest tightness with walking up  steep hill resolved with rest. I will arrange an exercise treadmill for risk stratification.  Kirk Ruths, MD

## 2016-10-20 DIAGNOSIS — H25013 Cortical age-related cataract, bilateral: Secondary | ICD-10-CM | POA: Diagnosis not present

## 2016-10-20 DIAGNOSIS — H524 Presbyopia: Secondary | ICD-10-CM | POA: Diagnosis not present

## 2016-10-23 ENCOUNTER — Ambulatory Visit (INDEPENDENT_AMBULATORY_CARE_PROVIDER_SITE_OTHER): Payer: Medicare Other | Admitting: Cardiology

## 2016-10-23 ENCOUNTER — Encounter: Payer: Self-pay | Admitting: Cardiology

## 2016-10-23 VITALS — BP 124/66 | HR 64 | Ht 67.0 in | Wt 161.0 lb

## 2016-10-23 DIAGNOSIS — E7800 Pure hypercholesterolemia, unspecified: Secondary | ICD-10-CM

## 2016-10-23 DIAGNOSIS — I10 Essential (primary) hypertension: Secondary | ICD-10-CM | POA: Diagnosis not present

## 2016-10-23 DIAGNOSIS — R0789 Other chest pain: Secondary | ICD-10-CM

## 2016-10-23 DIAGNOSIS — I679 Cerebrovascular disease, unspecified: Secondary | ICD-10-CM | POA: Diagnosis not present

## 2016-10-23 NOTE — Patient Instructions (Signed)
Medication Instructions:   NO CHANGE  Testing/Procedures:  Your physician has requested that you have a carotid duplex. This test is an ultrasound of the carotid arteries in your neck. It looks at blood flow through these arteries that supply the brain with blood. Allow one hour for this exam. There are no restrictions or special instructions.   Your physician has requested that you have an exercise tolerance test. For further information please visit [email or web address]. Please also follow instruction sheet, as given.    Follow-Up:  Your physician wants you to follow-up in: [place] will receive a reminder letter in the mail two months in advance. If you don't receive a letter, please call our office to schedule the follow-up appointment.   If you need a refill on your cardiac medications before your next appointment, please call your pharmacy.    Exercise Stress Electrocardiogram An exercise stress electrocardiogram is a test to check how blood flows to your heart. It is done to find areas of poor blood flow. You will need to walk on a treadmill for this test. The electrocardiogram will record your heartbeat when you are at rest and when you are exercising. What happens before the procedure?  Do not have drinks with caffeine or foods with caffeine for 24 hours before the test, or as told by your doctor. This includes coffee, tea (even decaf tea), sodas, chocolate, and cocoa.  Follow your doctor's instructions about eating and drinking before the test.  Ask your doctor what medicines you should or should not take before the test. Take your medicines with water unless told by your doctor not to.  If you use an inhaler, bring it with you to the test.  Bring a snack to eat after the test.  Do not  smoke for 4 hours before the test.  Do not put lotions, powders, creams, or oils on your chest before the test.  Wear comfortable shoes and clothing. What happens  during the procedure?  You will have patches put on your chest. Small areas of your chest may need to be shaved. Wires will be connected to the patches.  Your heart rate will be watched while you are resting and while you are exercising.  You will walk on the treadmill. The treadmill will slowly get faster to raise your heart rate.  The test will take about 1-2 hours. What happens after the procedure?  Your heart rate and blood pressure will be watched after the test.  You may return to your normal diet, activities, and medicines or as told by your doctor. This information is not intended to replace advice given to you by your health care provider. Make sure you discuss any questions you have with your health care provider. Document Released: 11/15/2007 Document Revised: 01/26/2016 Document Reviewed: 02/03/2013 Elsevier Interactive Patient Education  2017 [employer].

## 2016-10-31 ENCOUNTER — Ambulatory Visit: Payer: Medicare Other | Admitting: Cardiology

## 2016-11-07 ENCOUNTER — Other Ambulatory Visit: Payer: Self-pay | Admitting: Cardiology

## 2016-11-07 DIAGNOSIS — I6523 Occlusion and stenosis of bilateral carotid arteries: Secondary | ICD-10-CM

## 2016-11-07 DIAGNOSIS — I679 Cerebrovascular disease, unspecified: Secondary | ICD-10-CM

## 2016-11-15 ENCOUNTER — Inpatient Hospital Stay (HOSPITAL_COMMUNITY): Admission: RE | Admit: 2016-11-15 | Payer: Medicare Other | Source: Ambulatory Visit

## 2016-11-15 ENCOUNTER — Ambulatory Visit (HOSPITAL_COMMUNITY)
Admission: RE | Admit: 2016-11-15 | Discharge: 2016-11-15 | Disposition: A | Discharge status: Home / Self Care | Payer: Medicare Other | Source: Ambulatory Visit | Attending: Cardiology | Admitting: Cardiology

## 2016-11-15 DIAGNOSIS — I679 Cerebrovascular disease, unspecified: Secondary | ICD-10-CM

## 2016-11-15 DIAGNOSIS — I6523 Occlusion and stenosis of bilateral carotid arteries: Secondary | ICD-10-CM

## 2016-11-28 ENCOUNTER — Telehealth (HOSPITAL_COMMUNITY): Payer: Self-pay

## 2016-11-28 NOTE — Telephone Encounter (Signed)
Encounter complete. 

## 2016-11-29 ENCOUNTER — Telehealth (HOSPITAL_COMMUNITY): Payer: Self-pay

## 2016-11-29 NOTE — Telephone Encounter (Signed)
Encounter complete. 

## 2016-11-30 DIAGNOSIS — N952 Postmenopausal atrophic vaginitis: Secondary | ICD-10-CM | POA: Diagnosis not present

## 2016-11-30 DIAGNOSIS — Z87440 Personal history of urinary (tract) infections: Secondary | ICD-10-CM | POA: Diagnosis not present

## 2016-12-05 ENCOUNTER — Ambulatory Visit (HOSPITAL_COMMUNITY)
Admission: RE | Admit: 2016-12-05 | Discharge: 2016-12-05 | Disposition: A | Discharge status: Home / Self Care | Payer: Medicare Other | Source: Ambulatory Visit | Attending: Cardiovascular Disease | Admitting: Cardiovascular Disease

## 2016-12-05 DIAGNOSIS — R9439 Abnormal result of other cardiovascular function study: Secondary | ICD-10-CM | POA: Diagnosis not present

## 2016-12-05 DIAGNOSIS — R0789 Other chest pain: Secondary | ICD-10-CM | POA: Insufficient documentation

## 2016-12-05 LAB — EXERCISE TOLERANCE TEST
CHL RATE OF PERCEIVED EXERTION: 18
CSEPED: 6 min
CSEPEDS: 46 s
CSEPEW: 8.1 METS
CSEPHR: 102 %
MPHR: 147 {beats}/min
Peak HR: 151 {beats}/min
Rest HR: 67 {beats}/min

## 2016-12-18 ENCOUNTER — Encounter: Payer: Self-pay | Admitting: Cardiology

## 2016-12-18 ENCOUNTER — Ambulatory Visit (INDEPENDENT_AMBULATORY_CARE_PROVIDER_SITE_OTHER): Payer: Medicare Other | Admitting: Cardiology

## 2016-12-18 VITALS — BP 116/78 | HR 66 | Ht 67.0 in | Wt 164.0 lb

## 2016-12-18 DIAGNOSIS — R072 Precordial pain: Secondary | ICD-10-CM | POA: Diagnosis not present

## 2016-12-18 DIAGNOSIS — R9439 Abnormal result of other cardiovascular function study: Secondary | ICD-10-CM | POA: Diagnosis not present

## 2016-12-18 DIAGNOSIS — I10 Essential (primary) hypertension: Secondary | ICD-10-CM

## 2016-12-18 DIAGNOSIS — E7800 Pure hypercholesterolemia, unspecified: Secondary | ICD-10-CM

## 2016-12-18 NOTE — Progress Notes (Signed)
HPI: FU hypertension. Stress Myoview in January of 2011 showed normal perfusion and an ejection fraction of 76%. Echocardiogram in August of 2011 showed normal LV systolic function, mild left ventricular hypertrophy with diastolic dysfunction and mild tricuspid regurgitation. There was trace mitral regurgitation. Previous holter monitor in August of 2011 showed no significant arrhythmia. MRA of the neck in August of 2011 showed no occlusions, stenosis, dissection or pseudoaneurysms. Carotid Dopplers 6/18 showed 1-39% bilateral stenosis; fu recommended in 2 years. Exercise treadmill June 2018 showed 2 mm of ST depression in the inferior and lateral leads and chest pain. Study felt to be abnormal. Since last seen, she has mild dyspnea with more vigorous activities. An uncomfortable feeling in her upper chest when climbing hills. No syncope or significant palpitations.  Current Outpatient Prescriptions  Medication Sig Dispense Refill  . aspirin 81 MG tablet Take 81 mg by mouth daily.    . bisoprolol (ZEBETA) 5 MG tablet Take 1 tablet (5 mg total) by mouth daily. (Patient taking differently: Take 2.5 mg by mouth daily. ) 90 tablet 3  . calcium carbonate 200 MG capsule Take 250 mg by mouth 2 (two) times daily with a meal.    . cetirizine (ZYRTEC) 10 MG tablet Take 10 mg by mouth daily.    . cholecalciferol (VITAMIN D) 1000 UNITS tablet Take 1,000 Units by mouth daily.    Marland Kitchen levothyroxine (SYNTHROID, LEVOTHROID) 50 MCG tablet SAT- SUN    . levothyroxine (SYNTHROID, LEVOTHROID) 75 MCG tablet MON- FRI    . Methylcellulose, Laxative, (CITRUCEL PO) Take 2 tablets by mouth daily.    . Multiple Vitamin (MULTIVITAMIN) capsule Take 1 capsule by mouth daily.    Marland Kitchen omeprazole (PRILOSEC) 20 MG capsule Take 20 mg by mouth daily.    . rosuvastatin (CRESTOR) 10 MG tablet Take 10 mg by mouth daily.    Marland Kitchen triamterene-hydrochlorothiazide (MAXZIDE-25) 37.5-25 MG per tablet as needed.  0  . trimethoprim (TRIMPEX) 100  MG tablet Take 1 tablet by mouth at bedtime. For 3-4 months  3  . vitamin B-12 (CYANOCOBALAMIN) 1000 MCG tablet Take 1,000 mcg by mouth daily.     No current facility-administered medications for this visit.      Past Medical History:  Diagnosis Date  . Cerebrovascular disease   . Hypertension   . Hypothyroidism   . Obesity   . Palpitations     Past Surgical History:  Procedure Laterality Date  . ADENOIDECTOMY    . BLADDER SURGERY    . CHOLECYSTECTOMY    . ROTATOR CUFF REPAIR    . TONSILLECTOMY      Social History   Social History  . Marital status: Widowed    Spouse name: N/A  . Number of children: N/A  . Years of education: N/A   Occupational History  . Not on file.   Social History Main Topics  . Smoking status: Never Smoker  . Smokeless tobacco: Never Used  . Alcohol use No  . Drug use: No  . Sexual activity: Not on file   Other Topics Concern  . Not on file   Social History Narrative  . No narrative on file    Family History  Problem Relation Age of Onset  . Heart failure Unknown     ROS: no fevers or chills, productive cough, hemoptysis, dysphasia, odynophagia, melena, hematochezia, dysuria, hematuria, rash, seizure activity, orthopnea, PND, pedal edema, claudication. Remaining systems are negative.  Physical Exam: Well-developed well-nourished in no acute  distress.  Skin is warm and dry.  HEENT is normal.  Neck is supple.  Chest is clear to auscultation with normal expansion.  Cardiovascular exam is regular rate and rhythm.  Abdominal exam nontender or distended. No masses palpated. Extremities show no edema. neuro grossly intact   A/P  1 Abnormal exercise treadmill-I have personally reviewed the patient's exercise treadmill. ST changes are borderline.She has mild uncomfortable feeling when climbing hills in her chest. I will arrange a cardiac CTA to more fully assess.   2 hypertension-blood pressure is controlled. Continue present  medications.  3 carotid artery disease-1-39% bilateral stenosis on most recent carotid Dopplers.  4 hyperlipidemia-continue statin.  Kirk Ruths, MD

## 2016-12-18 NOTE — Patient Instructions (Signed)
Medication Instructions:   NO CHANGE  Testing/Procedures:  Your physician has requested that you have cardiac CT. Cardiac computed tomography (CT) is a painless test that uses an x-ray machine to take clear, detailed pictures of your heart. For further information please visit [email or web address]. Please follow instruction sheet as given.     Follow-Up:  Your physician wants you to follow-up in: [place] will receive a reminder letter in the mail two months in advance. If you don't receive a letter, please call our office to schedule the follow-up appointment.   If you need a refill on your cardiac medications before your next appointment, please call your pharmacy.

## 2017-01-02 ENCOUNTER — Telehealth: Payer: Self-pay | Admitting: Cardiology

## 2017-01-02 NOTE — Telephone Encounter (Signed)
CT morph ready for initial scheduling - see order.

## 2017-01-02 NOTE — Telephone Encounter (Signed)
New message    Pt is calling about scheduling Ct. She said she was told someone would call about authorizing it before scheduling but she has not heard from anyone. Please call.

## 2017-01-03 ENCOUNTER — Encounter: Payer: Self-pay | Admitting: Cardiology

## 2017-01-03 ENCOUNTER — Other Ambulatory Visit: Payer: Self-pay | Admitting: *Deleted

## 2017-01-03 DIAGNOSIS — R9439 Abnormal result of other cardiovascular function study: Secondary | ICD-10-CM

## 2017-01-08 ENCOUNTER — Other Ambulatory Visit: Payer: Medicare Other | Admitting: *Deleted

## 2017-01-08 DIAGNOSIS — R9439 Abnormal result of other cardiovascular function study: Secondary | ICD-10-CM | POA: Diagnosis not present

## 2017-01-08 LAB — BASIC METABOLIC PANEL
BUN / CREAT RATIO: 12 (ref 12–28)
BUN: 12 mg/dL (ref 8–27)
CO2: 26 mmol/L (ref 20–29)
CREATININE: 0.97 mg/dL (ref 0.57–1.00)
Calcium: 9.6 mg/dL (ref 8.7–10.3)
Chloride: 101 mmol/L (ref 96–106)
GFR, EST AFRICAN AMERICAN: 67 mL/min/{1.73_m2} (ref >59)
GFR, EST NON AFRICAN AMERICAN: 58 mL/min/{1.73_m2} — AB (ref >59)
GLUCOSE: 103 mg/dL — AB (ref 65–99)
Potassium: 4.2 mmol/L (ref 3.5–5.2)
SODIUM: 140 mmol/L (ref 134–144)

## 2017-01-11 DIAGNOSIS — R3121 Asymptomatic microscopic hematuria: Secondary | ICD-10-CM | POA: Diagnosis not present

## 2017-01-11 DIAGNOSIS — Z87440 Personal history of urinary (tract) infections: Secondary | ICD-10-CM | POA: Diagnosis not present

## 2017-01-11 DIAGNOSIS — R3912 Poor urinary stream: Secondary | ICD-10-CM | POA: Diagnosis not present

## 2017-01-15 ENCOUNTER — Encounter (HOSPITAL_COMMUNITY): Payer: Self-pay

## 2017-01-15 ENCOUNTER — Ambulatory Visit (HOSPITAL_COMMUNITY)
Admission: RE | Admit: 2017-01-15 | Discharge: 2017-01-15 | Disposition: A | Discharge status: Home / Self Care | Payer: Medicare Other | Source: Ambulatory Visit | Attending: Cardiology | Admitting: Cardiology

## 2017-01-15 DIAGNOSIS — R079 Chest pain, unspecified: Secondary | ICD-10-CM | POA: Diagnosis not present

## 2017-01-15 DIAGNOSIS — R269 Unspecified abnormalities of gait and mobility: Secondary | ICD-10-CM | POA: Diagnosis not present

## 2017-01-15 DIAGNOSIS — R072 Precordial pain: Secondary | ICD-10-CM | POA: Diagnosis not present

## 2017-01-15 DIAGNOSIS — R9439 Abnormal result of other cardiovascular function study: Secondary | ICD-10-CM | POA: Diagnosis not present

## 2017-01-15 DIAGNOSIS — I700 Atherosclerosis of aorta: Secondary | ICD-10-CM | POA: Diagnosis not present

## 2017-01-15 MED ORDER — METOPROLOL TARTRATE 5 MG/5ML IV SOLN
INTRAVENOUS | Status: AC
  Filled 2017-01-15: qty 5

## 2017-01-15 MED ORDER — NITROGLYCERIN 0.4 MG SL SUBL
0.8000 mg | SUBLINGUAL_TABLET | Freq: Once | SUBLINGUAL | Status: AC
  Administered 2017-01-15: 0.8 mg via SUBLINGUAL

## 2017-01-15 MED ORDER — NITROGLYCERIN 0.4 MG SL SUBL
SUBLINGUAL_TABLET | SUBLINGUAL | Status: AC
  Filled 2017-01-15: qty 2

## 2017-01-15 MED ORDER — METOPROLOL TARTRATE 5 MG/5ML IV SOLN
5.0000 mg | Freq: Once | INTRAVENOUS | Status: AC
  Administered 2017-01-15: 5 mg via INTRAVENOUS

## 2017-01-15 MED ORDER — IOPAMIDOL (ISOVUE-370) INJECTION 76%
INTRAVENOUS | Status: AC
  Administered 2017-01-15: 50 mL
  Filled 2017-01-15: qty 100

## 2017-01-15 NOTE — Progress Notes (Signed)
Tolerated CT without incident. PO challenge with water and tolerated water. Ambulatory steady gait.

## 2017-03-06 DIAGNOSIS — H2511 Age-related nuclear cataract, right eye: Secondary | ICD-10-CM | POA: Diagnosis not present

## 2017-03-06 DIAGNOSIS — H25811 Combined forms of age-related cataract, right eye: Secondary | ICD-10-CM | POA: Diagnosis not present

## 2017-03-06 DIAGNOSIS — H25011 Cortical age-related cataract, right eye: Secondary | ICD-10-CM | POA: Diagnosis not present

## 2017-03-20 DIAGNOSIS — H25812 Combined forms of age-related cataract, left eye: Secondary | ICD-10-CM | POA: Diagnosis not present

## 2017-03-20 DIAGNOSIS — H2512 Age-related nuclear cataract, left eye: Secondary | ICD-10-CM | POA: Diagnosis not present

## 2017-03-20 DIAGNOSIS — H25012 Cortical age-related cataract, left eye: Secondary | ICD-10-CM | POA: Diagnosis not present

## 2017-03-23 DIAGNOSIS — Z23 Encounter for immunization: Secondary | ICD-10-CM | POA: Diagnosis not present

## 2017-04-12 DIAGNOSIS — R8271 Bacteriuria: Secondary | ICD-10-CM | POA: Diagnosis not present

## 2017-04-12 DIAGNOSIS — N3020 Other chronic cystitis without hematuria: Secondary | ICD-10-CM | POA: Diagnosis not present

## 2017-04-17 DIAGNOSIS — R3912 Poor urinary stream: Secondary | ICD-10-CM | POA: Diagnosis not present

## 2017-04-17 DIAGNOSIS — R3914 Feeling of incomplete bladder emptying: Secondary | ICD-10-CM | POA: Diagnosis not present

## 2017-04-17 DIAGNOSIS — M6289 Other specified disorders of muscle: Secondary | ICD-10-CM | POA: Diagnosis not present

## 2017-04-17 DIAGNOSIS — M6281 Muscle weakness (generalized): Secondary | ICD-10-CM | POA: Diagnosis not present

## 2017-04-17 DIAGNOSIS — N952 Postmenopausal atrophic vaginitis: Secondary | ICD-10-CM | POA: Diagnosis not present

## 2017-05-01 DIAGNOSIS — M62838 Other muscle spasm: Secondary | ICD-10-CM | POA: Diagnosis not present

## 2017-05-01 DIAGNOSIS — R3912 Poor urinary stream: Secondary | ICD-10-CM | POA: Diagnosis not present

## 2017-05-01 DIAGNOSIS — R3914 Feeling of incomplete bladder emptying: Secondary | ICD-10-CM | POA: Diagnosis not present

## 2017-05-01 DIAGNOSIS — M6281 Muscle weakness (generalized): Secondary | ICD-10-CM | POA: Diagnosis not present

## 2017-05-01 DIAGNOSIS — K5900 Constipation, unspecified: Secondary | ICD-10-CM | POA: Diagnosis not present

## 2017-05-17 DIAGNOSIS — R3914 Feeling of incomplete bladder emptying: Secondary | ICD-10-CM | POA: Diagnosis not present

## 2017-05-17 DIAGNOSIS — K5900 Constipation, unspecified: Secondary | ICD-10-CM | POA: Diagnosis not present

## 2017-05-17 DIAGNOSIS — R3912 Poor urinary stream: Secondary | ICD-10-CM | POA: Diagnosis not present

## 2017-05-17 DIAGNOSIS — M62838 Other muscle spasm: Secondary | ICD-10-CM | POA: Diagnosis not present

## 2017-05-17 DIAGNOSIS — M6281 Muscle weakness (generalized): Secondary | ICD-10-CM | POA: Diagnosis not present

## 2017-06-08 DIAGNOSIS — M62838 Other muscle spasm: Secondary | ICD-10-CM | POA: Diagnosis not present

## 2017-06-08 DIAGNOSIS — K5900 Constipation, unspecified: Secondary | ICD-10-CM | POA: Diagnosis not present

## 2017-06-08 DIAGNOSIS — R3912 Poor urinary stream: Secondary | ICD-10-CM | POA: Diagnosis not present

## 2017-06-08 DIAGNOSIS — R3914 Feeling of incomplete bladder emptying: Secondary | ICD-10-CM | POA: Diagnosis not present

## 2017-06-08 DIAGNOSIS — M6281 Muscle weakness (generalized): Secondary | ICD-10-CM | POA: Diagnosis not present

## 2017-06-28 NOTE — Progress Notes (Signed)
HPI: FU hypertension. Echocardiogram in August of 2011 showed normal LV systolic function, mild left ventricular hypertrophy with diastolic dysfunction and mild tricuspid regurgitation. There was trace mitral regurgitation. Previous holter monitor in August of 2011 showed no significant arrhythmia. Carotid Dopplers 6/18 showed 1-39% bilateral stenosis; fu recommended in 2 years. Exercise treadmill June 2018 showed 2 mm of ST depression in the inferior and lateral leads and chest pain. Study felt to be abnormal. Cardiac CTA August 2018 showed no evidence of coronary disease and calcium score 0. Since last seen, the patient has dyspnea with more extreme activities but not with routine activities. It is relieved with rest. It is not associated with chest pain. There is no orthopnea, PND or pedal edema. There is no syncope or palpitations. There is no exertional chest pain.   Current Outpatient Medications  Medication Sig Dispense Refill  . aspirin 81 MG tablet Take 81 mg by mouth daily.    . bisoprolol (ZEBETA) 5 MG tablet Take 1 tablet (5 mg total) by mouth daily. (Patient taking differently: Take 2.5 mg by mouth daily. ) 90 tablet 3  . calcium carbonate 200 MG capsule Take 250 mg by mouth 2 (two) times daily with a meal.    . cetirizine (ZYRTEC) 10 MG tablet Take 10 mg by mouth daily.    . cholecalciferol (VITAMIN D) 1000 UNITS tablet Take 1,000 Units by mouth daily.    Marland Kitchen levothyroxine (SYNTHROID, LEVOTHROID) 50 MCG tablet SAT- SUN    . levothyroxine (SYNTHROID, LEVOTHROID) 75 MCG tablet MON- FRI    . Methylcellulose, Laxative, (CITRUCEL PO) Take 2 tablets by mouth daily.    . Multiple Vitamin (MULTIVITAMIN) capsule Take 1 capsule by mouth daily.    Marland Kitchen omeprazole (PRILOSEC) 20 MG capsule Take 20 mg by mouth daily.    . rosuvastatin (CRESTOR) 10 MG tablet Take 10 mg by mouth daily.    Marland Kitchen triamterene-hydrochlorothiazide (MAXZIDE-25) 37.5-25 MG per tablet as needed.  0  . vitamin B-12  (CYANOCOBALAMIN) 1000 MCG tablet Take 1,000 mcg by mouth daily.     No current facility-administered medications for this visit.      Past Medical History:  Diagnosis Date  . Cerebrovascular disease   . Hypertension   . Hypothyroidism   . Obesity   . Palpitations     Past Surgical History:  Procedure Laterality Date  . ADENOIDECTOMY    . BLADDER SURGERY    . CHOLECYSTECTOMY    . ROTATOR CUFF REPAIR    . TONSILLECTOMY      Social History   Socioeconomic History  . Marital status: Widowed    Spouse name: Not on file  . Number of children: Not on file  . Years of education: Not on file  . Highest education level: Not on file  Social Needs  . Financial resource strain: Not on file  . Food insecurity - worry: Not on file  . Food insecurity - inability: Not on file  . Transportation needs - medical: Not on file  . Transportation needs - non-medical: Not on file  Occupational History  . Not on file  Tobacco Use  . Smoking status: Never Smoker  . Smokeless tobacco: Never Used  Substance and Sexual Activity  . Alcohol use: No  . Drug use: No  . Sexual activity: Not on file  Other Topics Concern  . Not on file  Social History Narrative  . Not on file    Family History  Problem  Relation Age of Onset  . Heart failure Unknown     ROS: no fevers or chills, productive cough, hemoptysis, dysphasia, odynophagia, melena, hematochezia, dysuria, hematuria, rash, seizure activity, orthopnea, PND, pedal edema, claudication. Remaining systems are negative.  Physical Exam: Well-developed well-nourished in no acute distress.  Skin is warm and dry.  HEENT is normal.  Neck is supple.  Chest is clear to auscultation with normal expansion.  Cardiovascular exam is regular rate and rhythm.  Abdominal exam nontender or distended. No masses palpated. Extremities show no edema. neuro grossly intact   A/P  1 previous abnormal exercise treadmill-felt to be false positive.   Cardiac CTA showed no coronary disease and calcium score of 0.  2 hypertension-blood pressure controlled.  Continue present medications.  3 hyperlipidemia-continue statin.  Lipids and liver monitored by primary care.  4 carotid artery disease-1-39 bilateral stenosis on most recent dopplers; medical therapy.   Kirk Ruths, MD

## 2017-07-05 ENCOUNTER — Encounter: Payer: Self-pay | Admitting: Cardiology

## 2017-07-05 ENCOUNTER — Ambulatory Visit: Payer: Medicare Other | Admitting: Cardiology

## 2017-07-05 VITALS — BP 124/70 | HR 67 | Ht 67.0 in | Wt 163.6 lb

## 2017-07-05 DIAGNOSIS — I679 Cerebrovascular disease, unspecified: Secondary | ICD-10-CM

## 2017-07-05 DIAGNOSIS — I10 Essential (primary) hypertension: Secondary | ICD-10-CM

## 2017-07-05 DIAGNOSIS — E7800 Pure hypercholesterolemia, unspecified: Secondary | ICD-10-CM

## 2017-07-05 NOTE — Patient Instructions (Signed)
Your physician wants you to follow-up in: ONE YEAR WITH DR CRENSHAW You will receive a reminder letter in the mail two months in advance. If you don't receive a letter, please call our office to schedule the follow-up appointment.   If you need a refill on your cardiac medications before your next appointment, please call your pharmacy.  

## 2017-07-10 DIAGNOSIS — R3914 Feeling of incomplete bladder emptying: Secondary | ICD-10-CM | POA: Diagnosis not present

## 2017-07-10 DIAGNOSIS — K5900 Constipation, unspecified: Secondary | ICD-10-CM | POA: Diagnosis not present

## 2017-07-10 DIAGNOSIS — R3912 Poor urinary stream: Secondary | ICD-10-CM | POA: Diagnosis not present

## 2017-07-10 DIAGNOSIS — M6281 Muscle weakness (generalized): Secondary | ICD-10-CM | POA: Diagnosis not present

## 2017-07-10 DIAGNOSIS — M62838 Other muscle spasm: Secondary | ICD-10-CM | POA: Diagnosis not present

## 2017-07-30 DIAGNOSIS — N3020 Other chronic cystitis without hematuria: Secondary | ICD-10-CM | POA: Diagnosis not present

## 2017-07-30 DIAGNOSIS — R3914 Feeling of incomplete bladder emptying: Secondary | ICD-10-CM | POA: Diagnosis not present

## 2017-08-13 DIAGNOSIS — Z85828 Personal history of other malignant neoplasm of skin: Secondary | ICD-10-CM | POA: Diagnosis not present

## 2017-08-13 DIAGNOSIS — D225 Melanocytic nevi of trunk: Secondary | ICD-10-CM | POA: Diagnosis not present

## 2017-08-13 DIAGNOSIS — D2261 Melanocytic nevi of right upper limb, including shoulder: Secondary | ICD-10-CM | POA: Diagnosis not present

## 2017-08-13 DIAGNOSIS — L821 Other seborrheic keratosis: Secondary | ICD-10-CM | POA: Diagnosis not present

## 2017-08-13 DIAGNOSIS — D1801 Hemangioma of skin and subcutaneous tissue: Secondary | ICD-10-CM | POA: Diagnosis not present

## 2017-09-21 DIAGNOSIS — L570 Actinic keratosis: Secondary | ICD-10-CM | POA: Diagnosis not present

## 2017-09-24 DIAGNOSIS — M8580 Other specified disorders of bone density and structure, unspecified site: Secondary | ICD-10-CM | POA: Diagnosis not present

## 2017-09-24 DIAGNOSIS — I10 Essential (primary) hypertension: Secondary | ICD-10-CM | POA: Diagnosis not present

## 2017-09-24 DIAGNOSIS — E7800 Pure hypercholesterolemia, unspecified: Secondary | ICD-10-CM | POA: Diagnosis not present

## 2017-09-24 DIAGNOSIS — E039 Hypothyroidism, unspecified: Secondary | ICD-10-CM | POA: Diagnosis not present

## 2017-09-27 DIAGNOSIS — R0989 Other specified symptoms and signs involving the circulatory and respiratory systems: Secondary | ICD-10-CM | POA: Diagnosis not present

## 2017-09-27 DIAGNOSIS — K219 Gastro-esophageal reflux disease without esophagitis: Secondary | ICD-10-CM | POA: Diagnosis not present

## 2017-09-27 DIAGNOSIS — E039 Hypothyroidism, unspecified: Secondary | ICD-10-CM | POA: Diagnosis not present

## 2017-09-27 DIAGNOSIS — Z0000 Encounter for general adult medical examination without abnormal findings: Secondary | ICD-10-CM | POA: Diagnosis not present

## 2017-10-02 DIAGNOSIS — R922 Inconclusive mammogram: Secondary | ICD-10-CM | POA: Diagnosis not present

## 2017-10-02 DIAGNOSIS — D241 Benign neoplasm of right breast: Secondary | ICD-10-CM | POA: Diagnosis not present

## 2018-02-04 DIAGNOSIS — R3914 Feeling of incomplete bladder emptying: Secondary | ICD-10-CM | POA: Diagnosis not present

## 2018-02-04 DIAGNOSIS — N3020 Other chronic cystitis without hematuria: Secondary | ICD-10-CM | POA: Diagnosis not present

## 2018-03-11 DIAGNOSIS — T1511XA Foreign body in conjunctival sac, right eye, initial encounter: Secondary | ICD-10-CM | POA: Diagnosis not present

## 2018-03-12 DIAGNOSIS — N3020 Other chronic cystitis without hematuria: Secondary | ICD-10-CM | POA: Diagnosis not present

## 2018-03-22 DIAGNOSIS — Z23 Encounter for immunization: Secondary | ICD-10-CM | POA: Diagnosis not present

## 2018-04-04 DIAGNOSIS — D241 Benign neoplasm of right breast: Secondary | ICD-10-CM | POA: Diagnosis not present

## 2018-04-22 DIAGNOSIS — H26491 Other secondary cataract, right eye: Secondary | ICD-10-CM | POA: Diagnosis not present

## 2018-04-22 DIAGNOSIS — H5212 Myopia, left eye: Secondary | ICD-10-CM | POA: Diagnosis not present

## 2018-04-22 DIAGNOSIS — Z961 Presence of intraocular lens: Secondary | ICD-10-CM | POA: Diagnosis not present

## 2018-06-06 ENCOUNTER — Encounter: Payer: Self-pay | Admitting: Cardiology

## 2018-07-02 NOTE — Progress Notes (Signed)
HPI: FU hypertension. Echocardiogram in August of 2011 showed normal LV systolic function, mild left ventricular hypertrophy with diastolic dysfunction and mild tricuspid regurgitation. There was trace mitral regurgitation. Previous holter monitor in August of 2011 showed no significant arrhythmia. Carotid Dopplers6/18showed 1-39% bilateral stenosis; fu recommended in 2 years. Exercise treadmill June 2018 showed 2 mm of ST depression in the inferior and lateral leads and chest pain. Study felt to be abnormal.Cardiac CTA August 2018 showed no evidence of coronary disease and calcium score 0. Since last seen,  Current Outpatient Medications  Medication Sig Dispense Refill  . aspirin 81 MG tablet Take 81 mg by mouth daily.    . bisoprolol-hydrochlorothiazide (ZIAC) 5-6.25 MG tablet Take 0.5 tablets by mouth daily.    . calcium carbonate 200 MG capsule Take 250 mg by mouth 2 (two) times daily with a meal.    . cholecalciferol (VITAMIN D) 1000 UNITS tablet Take 1,000 Units by mouth daily.    Marland Kitchen CRANBERRY FRUIT PO Take 1 tablet by mouth 2 (two) times daily.    Marland Kitchen levothyroxine (SYNTHROID, LEVOTHROID) 50 MCG tablet SAT- SUN    . levothyroxine (SYNTHROID, LEVOTHROID) 75 MCG tablet MON- FRI    . Methylcellulose, Laxative, (CITRUCEL PO) Take 2 tablets by mouth daily.    . Multiple Vitamin (MULTIVITAMIN) capsule Take 1 capsule by mouth daily.    Marland Kitchen omeprazole (PRILOSEC) 20 MG capsule Take 20 mg by mouth daily.    . Probiotic Product (PROBIOTIC ADVANCED PO) Take 1 capsule by mouth 2 (two) times daily.    . rosuvastatin (CRESTOR) 10 MG tablet Take 10 mg by mouth every other day.     . triamterene-hydrochlorothiazide (MAXZIDE-25) 37.5-25 MG per tablet as needed.  0  . vitamin B-12 (CYANOCOBALAMIN) 1000 MCG tablet Take 1,000 mcg by mouth daily.     No current facility-administered medications for this visit.      Past Medical History:  Diagnosis Date  . Cerebrovascular disease   . Hypertension    . Hypothyroidism   . Obesity   . Palpitations     Past Surgical History:  Procedure Laterality Date  . ADENOIDECTOMY    . BLADDER SURGERY    . CHOLECYSTECTOMY    . ROTATOR CUFF REPAIR    . TONSILLECTOMY      Social History   Socioeconomic History  . Marital status: Widowed    Spouse name: Not on file  . Number of children: Not on file  . Years of education: Not on file  . Highest education level: Not on file  Occupational History  . Not on file  Social Needs  . Financial resource strain: Not on file  . Food insecurity:    Worry: Not on file    Inability: Not on file  . Transportation needs:    Medical: Not on file    Non-medical: Not on file  Tobacco Use  . Smoking status: Never Smoker  . Smokeless tobacco: Never Used  Substance and Sexual Activity  . Alcohol use: No  . Drug use: No  . Sexual activity: Not on file  Lifestyle  . Physical activity:    Days per week: Not on file    Minutes per session: Not on file  . Stress: Not on file  Relationships  . Social connections:    Talks on phone: Not on file    Gets together: Not on file    Attends religious service: Not on file    Active  member of club or organization: Not on file    Attends meetings of clubs or organizations: Not on file    Relationship status: Not on file  . Intimate partner violence:    Fear of current or ex partner: Not on file    Emotionally abused: Not on file    Physically abused: Not on file    Forced sexual activity: Not on file  Other Topics Concern  . Not on file  Social History Narrative  . Not on file    Family History  Problem Relation Age of Onset  . Heart failure Other     ROS: no fevers or chills, productive cough, hemoptysis, dysphasia, odynophagia, melena, hematochezia, dysuria, hematuria, rash, seizure activity, orthopnea, PND, pedal edema, claudication. Remaining systems are negative.  Physical Exam: Well-developed well-nourished in no acute distress.  Skin is  warm and dry.  HEENT is normal.  Neck is supple.  Chest is clear to auscultation with normal expansion.  Cardiovascular exam is regular rate and rhythm.  Abdominal exam nontender or distended. No masses palpated. Extremities show no edema. neuro grossly intact  ECG-normal sinus rhythm at a rate of 62, early repolarization.  Personally reviewed  A/P  1 hypertension-patient's blood pressure is controlled.  Continue present medications.  2 hyperlipidemia-continue statin.  Patient's cholesterol and liver functions are monitored by primary care.  3 previous abnormal exercise treadmill-follow-up CTA showed no coronary disease and calcium score of 0.  4 carotid artery disease-continue aspirin and statin.  Mild on most recent carotid Dopplers.  Kirk Ruths, MD

## 2018-07-05 ENCOUNTER — Ambulatory Visit: Payer: Medicare Other | Admitting: Cardiology

## 2018-07-05 ENCOUNTER — Encounter: Payer: Self-pay | Admitting: Cardiology

## 2018-07-05 VITALS — BP 132/76 | HR 62 | Ht 67.0 in | Wt 153.0 lb

## 2018-07-05 DIAGNOSIS — E7800 Pure hypercholesterolemia, unspecified: Secondary | ICD-10-CM | POA: Diagnosis not present

## 2018-07-05 DIAGNOSIS — I10 Essential (primary) hypertension: Secondary | ICD-10-CM | POA: Diagnosis not present

## 2018-07-05 DIAGNOSIS — I679 Cerebrovascular disease, unspecified: Secondary | ICD-10-CM

## 2018-07-05 NOTE — Patient Instructions (Signed)
Medication Instructions:  NO CHANGE If you need a refill on your cardiac medications before your next appointment, please call your pharmacy.   Lab work: If you have labs (blood work) drawn today and your tests are completely normal, you will receive your results only by: Marland Kitchen MyChart Message (if you have MyChart) OR . A paper copy in the mail If you have any lab test that is abnormal or we need to change your treatment, we will call you to review the results.  Follow-Up: At [facility], you and your health needs are our priority.  As part of our continuing mission to provide you with exceptional heart care, we have created designated Provider Care Teams.  These Care Teams include your primary Cardiologist (physician) and Advanced Practice Providers (APPs -  Physician Assistants and Nurse Practitioners) who all work together to provide you with the care you need, when you need it. You will need a follow up appointment in 12 months.  Please call our office 2 months in advance to schedule this appointment.  You may see Kirk Ruths MD or one of the following Advanced Practice Providers on your designated Care Team:   Kerin Ransom, PA-C Roby Lofts, [place] . Sande Rives, [place]  CALL IN November TO SCHEDULE APPOINTMENT FOR January 2021

## 2018-07-22 DIAGNOSIS — Z87440 Personal history of urinary (tract) infections: Secondary | ICD-10-CM | POA: Diagnosis not present

## 2018-09-23 DIAGNOSIS — C44712 Basal cell carcinoma of skin of right lower limb, including hip: Secondary | ICD-10-CM | POA: Diagnosis not present

## 2018-09-23 DIAGNOSIS — D225 Melanocytic nevi of trunk: Secondary | ICD-10-CM | POA: Diagnosis not present

## 2018-09-23 DIAGNOSIS — D2261 Melanocytic nevi of right upper limb, including shoulder: Secondary | ICD-10-CM | POA: Diagnosis not present

## 2018-09-23 DIAGNOSIS — L821 Other seborrheic keratosis: Secondary | ICD-10-CM | POA: Diagnosis not present

## 2018-09-23 DIAGNOSIS — D1801 Hemangioma of skin and subcutaneous tissue: Secondary | ICD-10-CM | POA: Diagnosis not present

## 2018-09-23 DIAGNOSIS — D485 Neoplasm of uncertain behavior of skin: Secondary | ICD-10-CM | POA: Diagnosis not present

## 2018-10-07 DIAGNOSIS — Z7982 Long term (current) use of aspirin: Secondary | ICD-10-CM | POA: Diagnosis not present

## 2018-10-07 DIAGNOSIS — E039 Hypothyroidism, unspecified: Secondary | ICD-10-CM | POA: Diagnosis not present

## 2018-10-07 DIAGNOSIS — I10 Essential (primary) hypertension: Secondary | ICD-10-CM | POA: Diagnosis not present

## 2018-10-07 DIAGNOSIS — E7800 Pure hypercholesterolemia, unspecified: Secondary | ICD-10-CM | POA: Diagnosis not present

## 2018-10-10 DIAGNOSIS — M810 Age-related osteoporosis without current pathological fracture: Secondary | ICD-10-CM | POA: Diagnosis not present

## 2018-10-10 DIAGNOSIS — Z0000 Encounter for general adult medical examination without abnormal findings: Secondary | ICD-10-CM | POA: Diagnosis not present

## 2018-10-10 DIAGNOSIS — E039 Hypothyroidism, unspecified: Secondary | ICD-10-CM | POA: Diagnosis not present

## 2018-10-10 DIAGNOSIS — I10 Essential (primary) hypertension: Secondary | ICD-10-CM | POA: Diagnosis not present

## 2018-11-14 DIAGNOSIS — N6459 Other signs and symptoms in breast: Secondary | ICD-10-CM | POA: Diagnosis not present

## 2018-11-14 DIAGNOSIS — N6312 Unspecified lump in the right breast, upper inner quadrant: Secondary | ICD-10-CM | POA: Diagnosis not present

## 2018-11-14 DIAGNOSIS — D241 Benign neoplasm of right breast: Secondary | ICD-10-CM | POA: Diagnosis not present

## 2018-11-14 DIAGNOSIS — R921 Mammographic calcification found on diagnostic imaging of breast: Secondary | ICD-10-CM | POA: Diagnosis not present

## 2018-11-18 ENCOUNTER — Other Ambulatory Visit: Payer: Self-pay | Admitting: Radiology

## 2018-11-18 DIAGNOSIS — D0502 Lobular carcinoma in situ of left breast: Secondary | ICD-10-CM | POA: Diagnosis not present

## 2018-11-18 DIAGNOSIS — R921 Mammographic calcification found on diagnostic imaging of breast: Secondary | ICD-10-CM | POA: Diagnosis not present

## 2018-11-28 ENCOUNTER — Ambulatory Visit (HOSPITAL_COMMUNITY): Payer: Self-pay | Admitting: Surgery

## 2018-11-28 DIAGNOSIS — D0502 Lobular carcinoma in situ of left breast: Secondary | ICD-10-CM

## 2018-12-02 DIAGNOSIS — Z1212 Encounter for screening for malignant neoplasm of rectum: Secondary | ICD-10-CM | POA: Diagnosis not present

## 2018-12-02 DIAGNOSIS — D0502 Lobular carcinoma in situ of left breast: Secondary | ICD-10-CM | POA: Diagnosis not present

## 2018-12-11 DIAGNOSIS — C801 Malignant (primary) neoplasm, unspecified: Secondary | ICD-10-CM

## 2018-12-11 HISTORY — DX: Malignant (primary) neoplasm, unspecified: C80.1

## 2019-01-03 ENCOUNTER — Other Ambulatory Visit: Payer: Self-pay

## 2019-01-03 ENCOUNTER — Encounter (HOSPITAL_BASED_OUTPATIENT_CLINIC_OR_DEPARTMENT_OTHER): Payer: Self-pay | Admitting: *Deleted

## 2019-01-07 ENCOUNTER — Other Ambulatory Visit (HOSPITAL_COMMUNITY)
Admission: RE | Admit: 2019-01-07 | Discharge: 2019-01-07 | Disposition: A | Discharge status: Home / Self Care | Payer: Medicare Other | Source: Ambulatory Visit | Attending: Surgery | Admitting: Surgery

## 2019-01-07 DIAGNOSIS — Z20828 Contact with and (suspected) exposure to other viral communicable diseases: Secondary | ICD-10-CM | POA: Insufficient documentation

## 2019-01-07 LAB — SARS CORONAVIRUS 2 (TAT 6-24 HRS): SARS Coronavirus 2: NEGATIVE

## 2019-01-09 DIAGNOSIS — R921 Mammographic calcification found on diagnostic imaging of breast: Secondary | ICD-10-CM | POA: Diagnosis not present

## 2019-01-10 ENCOUNTER — Other Ambulatory Visit: Payer: Self-pay | Admitting: Radiology

## 2019-01-10 ENCOUNTER — Ambulatory Visit (HOSPITAL_BASED_OUTPATIENT_CLINIC_OR_DEPARTMENT_OTHER): Admission: RE | Admit: 2019-01-10 | Payer: Medicare Other | Source: Home / Self Care | Admitting: Surgery

## 2019-01-10 DIAGNOSIS — D0502 Lobular carcinoma in situ of left breast: Secondary | ICD-10-CM

## 2019-01-10 HISTORY — DX: Nausea with vomiting, unspecified: R11.2

## 2019-01-10 HISTORY — DX: Other specified postprocedural states: Z98.890

## 2019-01-10 HISTORY — DX: Other complications of anesthesia, initial encounter: T88.59XA

## 2019-01-10 HISTORY — DX: Gastro-esophageal reflux disease without esophagitis: K21.9

## 2019-01-10 SURGERY — BREAST LUMPECTOMY WITH RADIOACTIVE SEED LOCALIZATION
Anesthesia: General | Site: Breast | Laterality: Left

## 2019-01-14 ENCOUNTER — Other Ambulatory Visit: Payer: Self-pay

## 2019-01-14 ENCOUNTER — Ambulatory Visit
Admission: RE | Admit: 2019-01-14 | Discharge: 2019-01-14 | Disposition: A | Discharge status: Home / Self Care | Payer: Medicare Other | Source: Ambulatory Visit | Attending: Radiology | Admitting: Radiology

## 2019-01-14 DIAGNOSIS — D0502 Lobular carcinoma in situ of left breast: Secondary | ICD-10-CM

## 2019-01-14 MED ORDER — GADOBUTROL 1 MMOL/ML IV SOLN
7.0000 mL | Freq: Once | INTRAVENOUS | Status: AC | PRN
  Administered 2019-01-14: 7 mL via INTRAVENOUS

## 2019-01-14 MED ORDER — GADOBENATE DIMEGLUMINE 529 MG/ML IV SOLN: 7.0000 mL | Freq: Once | INTRAVENOUS | Status: DC | PRN

## 2019-01-20 ENCOUNTER — Telehealth (HOSPITAL_COMMUNITY): Payer: Self-pay | Admitting: Surgery

## 2019-01-20 NOTE — Telephone Encounter (Signed)
No note  ERROR

## 2019-01-21 ENCOUNTER — Other Ambulatory Visit (HOSPITAL_COMMUNITY): Payer: Self-pay | Admitting: Surgery

## 2019-02-07 ENCOUNTER — Encounter (HOSPITAL_BASED_OUTPATIENT_CLINIC_OR_DEPARTMENT_OTHER): Payer: Self-pay | Admitting: *Deleted

## 2019-02-07 ENCOUNTER — Other Ambulatory Visit: Payer: Self-pay

## 2019-02-11 ENCOUNTER — Other Ambulatory Visit: Payer: Self-pay

## 2019-02-11 ENCOUNTER — Other Ambulatory Visit (HOSPITAL_COMMUNITY)
Admission: RE | Admit: 2019-02-11 | Discharge: 2019-02-11 | Disposition: A | Discharge status: Home / Self Care | Payer: Medicare Other | Source: Ambulatory Visit | Attending: Surgery | Admitting: Surgery

## 2019-02-11 ENCOUNTER — Encounter (HOSPITAL_BASED_OUTPATIENT_CLINIC_OR_DEPARTMENT_OTHER)
Admission: RE | Admit: 2019-02-11 | Discharge: 2019-02-11 | Disposition: A | Discharge status: Home / Self Care | Payer: Medicare Other | Source: Ambulatory Visit | Attending: Surgery | Admitting: Surgery

## 2019-02-11 DIAGNOSIS — Z01812 Encounter for preprocedural laboratory examination: Secondary | ICD-10-CM | POA: Insufficient documentation

## 2019-02-11 DIAGNOSIS — C50912 Malignant neoplasm of unspecified site of left female breast: Secondary | ICD-10-CM | POA: Insufficient documentation

## 2019-02-11 DIAGNOSIS — Z20828 Contact with and (suspected) exposure to other viral communicable diseases: Secondary | ICD-10-CM | POA: Diagnosis not present

## 2019-02-11 LAB — BASIC METABOLIC PANEL
Anion gap: 9 (ref 5–15)
BUN: 13 mg/dL (ref 8–23)
CO2: 28 mmol/L (ref 22–32)
Calcium: 9.6 mg/dL (ref 8.9–10.3)
Chloride: 102 mmol/L (ref 98–111)
Creatinine, Ser: 0.74 mg/dL (ref 0.44–1.00)
GFR calc Af Amer: >60 mL/min (ref >60)
GFR calc non Af Amer: >60 mL/min (ref >60)
Glucose, Bld: 104 mg/dL — ABNORMAL HIGH (ref 70–99)
Potassium: 4 mmol/L (ref 3.5–5.1)
Sodium: 139 mmol/L (ref 135–145)

## 2019-02-11 LAB — SARS CORONAVIRUS 2 (TAT 6-24 HRS): SARS Coronavirus 2: NEGATIVE

## 2019-02-11 NOTE — Progress Notes (Signed)

## 2019-02-13 DIAGNOSIS — C50412 Malignant neoplasm of upper-outer quadrant of left female breast: Secondary | ICD-10-CM | POA: Diagnosis not present

## 2019-02-13 NOTE — H&P (Signed)
[place]  Location: [facility] Patient #: [id number] DOB: 29-Oct-1940 Widowed / Language: Cleophus Molt / Race: White Female  History of Present Illness   The patient is a 78 year old female who presents with a complaint of breast cancer.  The PCP is Dr. Deland Pretty  The patient was referred by Dr. Emmit Pomfret  She is accompanied by her daughters Threasa Beards and Kathlyn [place] Covid-19 virus has disrupted normal medical care in [place] and across the nation. We have sometimes had to alter normal surgical/medical care to limit this epidemic and we have explained these changes to the patient.]  She has got an annual mammograms. She did have a benign biopsy of her right breast several years ago. She is not on hormone replacement. Her mother's sister had breast cancer in the 35s. She underwent a mammogram at [facility] that showed some microcalcifications in the left breast. She also had a mass in the right breast, but that is benign and just needs routine follow-up. I spoke with Dr. Isaiah Blakes about her report. She underwent a biopsy of the left breast at [facility] on 11/18/2018. The pathology - [id number] - shows lobular carcinoma in situ with ca++.  I discussed the nature of lobular carcinoma in situ. This is really a precancerous lesion. Since this is associated with microcalcifications, a conservative excision of this area for breast which seem appropriate. Depending on her final pathology, she could be referred to medical oncology as a high-risk patient.  Plan: 1) Left breast lumpectomy (seed localization)  Review of Systems as stated in this history (HPI) or in the review of systems. Otherwise all other 12 point ROS are negative  Past Medical History: 1. Sees Dr. Thresa Ross for cardiology 2. HTN x 30 years 3. Thyroid replacement Thyroid nodule 4. History of cholecystectomy I did this around 1995 5. Colonoscopy by Dr. Benson Norway - 2017 6.  Hysterectomy and one ovary - 1980 for endometriosis/bleeding 7. Migraine headaches - which resolved when she retired 8. 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 The pathology - [id number] - shows lobular carcinoma in situ with ca++  Past Surgical History Nance Pew, CMA; 11/28/2018 8:53 AM) Appendectomy  Breast Biopsy  Bilateral. Cataract Surgery  Bilateral. Colon Polyp Removal - Colonoscopy  Gallbladder Surgery - Laparoscopic  Hysterectomy (not due to cancer) - Partial  Shoulder Surgery  Right. Tonsillectomy   Diagnostic Studies History Nance Pew, CMA; 11/28/2018 8:53 AM) Colonoscopy  1-5 years ago Mammogram  within last year Pap Smear  >5 years ago  Allergies Nance Pew, CMA; 11/28/2018 8:55 AM) NSAIDs  Penicillins  Cipro *FLUOROQUINOLONES*  Prolia *ENDOCRINE AND METABOLIC AGENTS - MISC.*  Tetanus Toxoid Adsorbed *TOXOIDS*  Allergies Reconciled   Medication History (Sabrina Canty, CMA; 11/28/2018 8:56 AM) Bisoprolol-hydroCHLOROthiazide (5-6.25MG  Tablet, Oral) Active. Clindamycin HCl (150MG  Capsule, Oral) Active. Levothyroxine Sodium (50MCG Tablet, Oral) Active. Rosuvastatin Calcium (10MG  Tablet, Oral) Active. Omeprazole (2MG /ML Suspension, Oral) Active. Medications Reconciled  Social History Nance Pew, CMA; 11/28/2018 8:53 AM) No alcohol use  No caffeine use  No drug use  Tobacco use  Never smoker.  Family History Nance Pew, [place]; 11/28/2018 8:53 AM) Arthritis  Father, Mother. Diabetes Mellitus  Father. Hypertension  Father, Mother. Migraine Headache  Daughter. Ovarian Cancer  Sister. Thyroid problems   Daughter.  Pregnancy / Birth History Nance Pew, [place]; 11/28/2018 8:53 AM) Age at menarche  39 years. Age of menopause  <47 Contraceptive History  Oral contraceptives. Gravida  2 Maternal age  8-25 Para  1  Other Problems Nance Pew, CMA; 11/28/2018 8:54 AM) Arthritis  Back Pain  Bladder Problems  Cholelithiasis  Gastric Ulcer  Gastroesophageal Reflux Disease  Hemorrhoids  High blood pressure  Migraine Headache  Thyroid Disease     Review of Systems (Sabrina Canty CMA; 11/28/2018 8:53 AM) General Not Present- Appetite Loss, Chills, Fatigue, Fever, Night Sweats, Weight Gain and Weight Loss. Skin Present- Dryness. Not Present- Change in Wart/Mole, Hives, Jaundice, New Lesions, Non-Healing Wounds, Rash and Ulcer. HEENT Present- Hearing Loss and Nose Bleed. Not Present- Earache, Hoarseness, Oral Ulcers, Ringing in the Ears, Seasonal Allergies, Sinus Pain, Sore Throat, Visual Disturbances, Wears glasses/contact lenses and Yellow Eyes. Respiratory Present- Snoring. Not Present- Bloody sputum, Chronic Cough, Difficulty Breathing and Wheezing. Breast Not Present- Breast Mass, Breast Pain, Nipple Discharge and Skin Changes. Cardiovascular Not Present- Chest Pain, Difficulty Breathing Lying Down, Leg Cramps, Palpitations, Rapid Heart Rate, Shortness of Breath and Swelling of Extremities. Gastrointestinal Not Present- Abdominal Pain, Bloating, Bloody Stool, Change in Bowel Habits, Chronic diarrhea, Constipation, Difficulty Swallowing, Excessive gas, Gets full quickly at meals, Hemorrhoids, Indigestion, Nausea, Rectal Pain and Vomiting. Female Genitourinary Present- Frequency. Not Present- Nocturia, Painful Urination, Pelvic Pain and Urgency. Musculoskeletal Present- Back Pain. Not Present- Joint Pain, Joint Stiffness, Muscle Pain, Muscle Weakness and Swelling of Extremities. Neurological Present- Tremor. Not Present- Decreased Memory, Fainting, Headaches, Numbness,  Seizures, Tingling, Trouble walking and Weakness. Psychiatric Not Present- Anxiety, Bipolar, Change in Sleep Pattern, Depression, Fearful and Frequent crying. Endocrine Present- Hot flashes. Not Present- Cold Intolerance, Excessive Hunger, Hair Changes, Heat Intolerance and New Diabetes. Hematology Present- Blood Thinners. Not Present- Easy Bruising, Excessive bleeding, Gland problems, HIV and Persistent Infections.  Vitals (Sabrina Canty CMA; 11/28/2018 8:57 AM) 11/28/2018 8:56 AM Weight: 149.8 lb Height: 66in Body Surface Area: 1.77 m Body Mass Index: 24.18 kg/m  Temp.: 74F (Temporal)  Pulse: 99 (Regular)  BP: 138/70(Sitting, Left Arm, Standard)  Physical Exam  General: Older WF who is alert and generally healthy appearing. Skin: Inspection and palpation of the skin unremarkable.  Eyes: Conjunctivae white, pupils equal. Face, ears, nose, mouth, and throat: Face - normal. Normal ears and nose. Lips and teeth normal.  Neck: Supple. No mass. Trachea midline. No thyroid mass. She says that she can sometimes feel a nodule in her thyroid, I could not. Lymph Nodes: No supraclavicular or cervical adenopathy. No axillary adenopathy.  Lungs: Normal respiratory effort. Clear to auscultation and symmetric breath sounds. Cardiovascular: Regular rate and rythm. Normal auscultation of the heart. No murmur or rub.  Breasts: Right - No mass or lesion  Left - Scar from core biopsy at 2 o'clock. No mass  Abdomen: Soft. No mass. Liver and spleen not palpable. No tenderness. No hernia. Normal bowel sounds. Has laparoscopic and Pfannenstiel incision Rectal: Not done.  Musculoskeletal/extremities: Normal gait. Good strength and ROM in upper and lower extremities.  Neurologic: Grossly intact to motor and sensory function. Psychiatric: Has normal mood and affect. Judgement and insight appear normal.    Assessment & Plan  1.  LOBULAR CARCINOMA IN SITU (LCIS) OF LEFT BREAST  (D05.02)  Story: Biopsy of the left breast at [facility] on 11/18/2018. The pathology - [id number] -  shows lobular carcinoma in situ with ca++  Plan:   1. Left breast lumpectomy (seed localization)  Addendum Note(Richad Ramsay H. [name] MD; 01/09/2019 10:31 AM)  Dr. Isaiah Blakes gave me a call about Ms. Dasaro  Ms. Kicklighter is for a seed localization - but her clip migrated and there is nothing to mark with a seed  Dr. Isaiah Blakes is going to order an MRI - which I think is a good idea  If there is a lesion to biopsy/mark - we will do it. If there is no lesion, then she'll need to be followed.  Addendum Note(Kellyn Mccary H. [name] MD; 01/18/2019 1:41 PM)  MRI - 01/14/2019  IMPRESSION:   1. Single focal area of non mass enhancement within the LATERAL portion of the LEFT breast, middle depth.   2. The tissue marker clip artifact is present along the inferior aspect of this lesion.   3. No suspicious findings in the RIGHT breast.  RECOMMENDATION:   1. placing a radioactive seed immediately adjacent to or just superior to the existing dumbbell-shaped tissue marker clip.   2. Alternatively, consider MR guided core biopsy of the non mass enhancement and placement of a discrete clip for future localization purposes.  Addendum Note(Chyna Kneece H. [name] MD; 01/20/2019 10:32 AM)  I spoke to Dr. Isaiah Blakes. She is gong to review the MRI and then decide on a one or two step approach to the area of concern.  Addendum Note(Betta Balla H. [name] MD; 01/21/2019 11:56 AM)  Dr. Isaiah Blakes thinks that she can localize the area.  2. Sees Dr. Thresa Ross for cardiology 3. HTN x 30 years 4. Thyroid replacement Thyroid nodule 5. Migraine headaches - which resolved when she retired 6. Stomach ulcers related to NSAIDs in the mid 2000's  Alphonsa Overall, MD, [facility] Surgery Pager: [phone] Office phone:  [phone]

## 2019-02-14 ENCOUNTER — Encounter (HOSPITAL_BASED_OUTPATIENT_CLINIC_OR_DEPARTMENT_OTHER): Payer: Self-pay | Admitting: Certified Registered"

## 2019-02-14 ENCOUNTER — Ambulatory Visit (HOSPITAL_BASED_OUTPATIENT_CLINIC_OR_DEPARTMENT_OTHER): Payer: Medicare Other | Admitting: Certified Registered"

## 2019-02-14 ENCOUNTER — Ambulatory Visit (HOSPITAL_BASED_OUTPATIENT_CLINIC_OR_DEPARTMENT_OTHER)
Admission: RE | Admit: 2019-02-14 | Discharge: 2019-02-14 | Disposition: A | Discharge status: Home / Self Care | Payer: Medicare Other | Attending: Surgery | Admitting: Surgery

## 2019-02-14 ENCOUNTER — Encounter (HOSPITAL_BASED_OUTPATIENT_CLINIC_OR_DEPARTMENT_OTHER)
Admission: RE | Disposition: A | Discharge status: Home / Self Care | Payer: Self-pay | Source: Home / Self Care | Attending: Surgery

## 2019-02-14 DIAGNOSIS — D0502 Lobular carcinoma in situ of left breast: Secondary | ICD-10-CM | POA: Insufficient documentation

## 2019-02-14 DIAGNOSIS — Z90710 Acquired absence of both cervix and uterus: Secondary | ICD-10-CM | POA: Insufficient documentation

## 2019-02-14 DIAGNOSIS — Z9049 Acquired absence of other specified parts of digestive tract: Secondary | ICD-10-CM | POA: Diagnosis not present

## 2019-02-14 DIAGNOSIS — Z8249 Family history of ischemic heart disease and other diseases of the circulatory system: Secondary | ICD-10-CM | POA: Diagnosis not present

## 2019-02-14 DIAGNOSIS — Z8261 Family history of arthritis: Secondary | ICD-10-CM | POA: Diagnosis not present

## 2019-02-14 DIAGNOSIS — Z8349 Family history of other endocrine, nutritional and metabolic diseases: Secondary | ICD-10-CM | POA: Insufficient documentation

## 2019-02-14 DIAGNOSIS — M549 Dorsalgia, unspecified: Secondary | ICD-10-CM | POA: Insufficient documentation

## 2019-02-14 DIAGNOSIS — N6022 Fibroadenosis of left breast: Secondary | ICD-10-CM | POA: Insufficient documentation

## 2019-02-14 DIAGNOSIS — Z79899 Other long term (current) drug therapy: Secondary | ICD-10-CM | POA: Insufficient documentation

## 2019-02-14 DIAGNOSIS — Z888 Allergy status to other drugs, medicaments and biological substances status: Secondary | ICD-10-CM | POA: Diagnosis not present

## 2019-02-14 DIAGNOSIS — Z8711 Personal history of peptic ulcer disease: Secondary | ICD-10-CM | POA: Diagnosis not present

## 2019-02-14 DIAGNOSIS — Z9841 Cataract extraction status, right eye: Secondary | ICD-10-CM | POA: Insufficient documentation

## 2019-02-14 DIAGNOSIS — Z9842 Cataract extraction status, left eye: Secondary | ICD-10-CM | POA: Insufficient documentation

## 2019-02-14 DIAGNOSIS — Z880 Allergy status to penicillin: Secondary | ICD-10-CM | POA: Diagnosis not present

## 2019-02-14 DIAGNOSIS — Z833 Family history of diabetes mellitus: Secondary | ICD-10-CM | POA: Diagnosis not present

## 2019-02-14 DIAGNOSIS — G43909 Migraine, unspecified, not intractable, without status migrainosus: Secondary | ICD-10-CM | POA: Diagnosis not present

## 2019-02-14 DIAGNOSIS — Z887 Allergy status to serum and vaccine status: Secondary | ICD-10-CM | POA: Insufficient documentation

## 2019-02-14 DIAGNOSIS — E785 Hyperlipidemia, unspecified: Secondary | ICD-10-CM | POA: Diagnosis not present

## 2019-02-14 DIAGNOSIS — Z8041 Family history of malignant neoplasm of ovary: Secondary | ICD-10-CM | POA: Diagnosis not present

## 2019-02-14 DIAGNOSIS — Z86010 Personal history of colonic polyps: Secondary | ICD-10-CM | POA: Insufficient documentation

## 2019-02-14 DIAGNOSIS — I10 Essential (primary) hypertension: Secondary | ICD-10-CM | POA: Diagnosis not present

## 2019-02-14 DIAGNOSIS — C50412 Malignant neoplasm of upper-outer quadrant of left female breast: Secondary | ICD-10-CM | POA: Diagnosis not present

## 2019-02-14 DIAGNOSIS — E039 Hypothyroidism, unspecified: Secondary | ICD-10-CM | POA: Diagnosis not present

## 2019-02-14 DIAGNOSIS — Z820 Family history of epilepsy and other diseases of the nervous system: Secondary | ICD-10-CM | POA: Insufficient documentation

## 2019-02-14 HISTORY — PX: BREAST LUMPECTOMY WITH RADIOACTIVE SEED LOCALIZATION: SHX6424

## 2019-02-14 SURGERY — BREAST LUMPECTOMY WITH RADIOACTIVE SEED LOCALIZATION
Anesthesia: General | Site: Breast | Laterality: Left

## 2019-02-14 MED ORDER — OXYCODONE HCL 5 MG PO TABS: 5.0000 mg | ORAL_TABLET | Freq: Once | ORAL | Status: DC | PRN

## 2019-02-14 MED ORDER — PROMETHAZINE HCL 25 MG/ML IJ SOLN: 6.2500 mg | INTRAMUSCULAR | Status: DC | PRN

## 2019-02-14 MED ORDER — OXYCODONE HCL 5 MG/5ML PO SOLN: 5.0000 mg | Freq: Once | ORAL | Status: DC | PRN

## 2019-02-14 MED ORDER — BUPIVACAINE-EPINEPHRINE (PF) 0.25% -1:200000 IJ SOLN
INTRAMUSCULAR | Status: AC
  Filled 2019-02-14: qty 30

## 2019-02-14 MED ORDER — CHLORHEXIDINE GLUCONATE CLOTH 2 % EX PADS: 6.0000 | MEDICATED_PAD | Freq: Once | CUTANEOUS | Status: DC

## 2019-02-14 MED ORDER — CEFAZOLIN SODIUM-DEXTROSE 2-4 GM/100ML-% IV SOLN: 2.0000 g | INTRAVENOUS | Status: DC

## 2019-02-14 MED ORDER — LIDOCAINE HCL (CARDIAC) PF 100 MG/5ML IV SOSY
PREFILLED_SYRINGE | INTRAVENOUS | Status: DC | PRN
  Administered 2019-02-14: 60 mg via INTRAVENOUS

## 2019-02-14 MED ORDER — PROPOFOL 10 MG/ML IV BOLUS
INTRAVENOUS | Status: DC | PRN
  Administered 2019-02-14: 150 mg via INTRAVENOUS

## 2019-02-14 MED ORDER — ONDANSETRON HCL 4 MG/2ML IJ SOLN
INTRAMUSCULAR | Status: DC | PRN
  Administered 2019-02-14: 4 mg via INTRAVENOUS

## 2019-02-14 MED ORDER — HYDROMORPHONE HCL 1 MG/ML IJ SOLN: 0.2500 mg | INTRAMUSCULAR | Status: DC | PRN

## 2019-02-14 MED ORDER — ACETAMINOPHEN 500 MG PO TABS
ORAL_TABLET | ORAL | Status: AC
  Filled 2019-02-14: qty 2

## 2019-02-14 MED ORDER — ACETAMINOPHEN 500 MG PO TABS
1000.0000 mg | ORAL_TABLET | ORAL | Status: AC
  Administered 2019-02-14: 1000 mg via ORAL

## 2019-02-14 MED ORDER — 0.9 % SODIUM CHLORIDE (POUR BTL) OPTIME
TOPICAL | Status: DC | PRN
  Administered 2019-02-14: 15:00:00 300 mL

## 2019-02-14 MED ORDER — FENTANYL CITRATE (PF) 100 MCG/2ML IJ SOLN
INTRAMUSCULAR | Status: AC
  Filled 2019-02-14: qty 2

## 2019-02-14 MED ORDER — FENTANYL CITRATE (PF) 100 MCG/2ML IJ SOLN
50.0000 ug | INTRAMUSCULAR | Status: DC | PRN
  Administered 2019-02-14: 14:00:00 50 ug via INTRAVENOUS

## 2019-02-14 MED ORDER — LACTATED RINGERS IV SOLN
INTRAVENOUS | Status: DC
  Administered 2019-02-14: 14:00:00 via INTRAVENOUS

## 2019-02-14 MED ORDER — PROPOFOL 500 MG/50ML IV EMUL
INTRAVENOUS | Status: DC | PRN
  Administered 2019-02-14: 25 ug/kg/min via INTRAVENOUS

## 2019-02-14 MED ORDER — CEFAZOLIN SODIUM-DEXTROSE 2-4 GM/100ML-% IV SOLN
INTRAVENOUS | Status: AC
  Filled 2019-02-14: qty 100

## 2019-02-14 MED ORDER — TRAMADOL HCL 50 MG PO TABS: 50.0000 mg | ORAL_TABLET | Freq: Four times a day (QID) | ORAL | 0 refills | Status: DC | PRN

## 2019-02-14 MED ORDER — SCOPOLAMINE 1 MG/3DAYS TD PT72: 1.0000 | MEDICATED_PATCH | Freq: Once | TRANSDERMAL | Status: DC

## 2019-02-14 MED ORDER — DEXAMETHASONE SODIUM PHOSPHATE 4 MG/ML IJ SOLN
INTRAMUSCULAR | Status: DC | PRN
  Administered 2019-02-14: 4 mg via INTRAVENOUS

## 2019-02-14 MED ORDER — MIDAZOLAM HCL 2 MG/2ML IJ SOLN: 1.0000 mg | INTRAMUSCULAR | Status: DC | PRN

## 2019-02-14 MED ORDER — CIPROFLOXACIN IN D5W 400 MG/200ML IV SOLN
INTRAVENOUS | Status: AC
  Filled 2019-02-14: qty 200

## 2019-02-14 MED ORDER — CIPROFLOXACIN IN D5W 400 MG/200ML IV SOLN
INTRAVENOUS | Status: DC | PRN
  Administered 2019-02-14: 400 mg via INTRAVENOUS

## 2019-02-14 MED ORDER — BUPIVACAINE-EPINEPHRINE 0.25% -1:200000 IJ SOLN
INTRAMUSCULAR | Status: DC | PRN
  Administered 2019-02-14: 30 mL

## 2019-02-14 SURGICAL SUPPLY — 62 items
ADH SKN CLS APL DERMABOND .7 (GAUZE/BANDAGES/DRESSINGS) ×1
APL PRP STRL LF DISP 70% ISPRP (MISCELLANEOUS) ×1
APL SKNCLS STERI-STRIP NONHPOA (GAUZE/BANDAGES/DRESSINGS)
BENZOIN TINCTURE PRP APPL 2/3 (GAUZE/BANDAGES/DRESSINGS) IMPLANT
BINDER BREAST LRG (GAUZE/BANDAGES/DRESSINGS) ×2 IMPLANT
BINDER BREAST MEDIUM (GAUZE/BANDAGES/DRESSINGS) IMPLANT
BINDER BREAST XLRG (GAUZE/BANDAGES/DRESSINGS) IMPLANT
BINDER BREAST XXLRG (GAUZE/BANDAGES/DRESSINGS) IMPLANT
BLADE HEX COATED 2.75 (ELECTRODE) IMPLANT
BLADE SURG 10 STRL SS (BLADE) ×1 IMPLANT
BLADE SURG 15 STRL LF DISP TIS (BLADE) ×1 IMPLANT
BLADE SURG 15 STRL SS (BLADE) ×3
CANISTER SUC SOCK COL 7IN (MISCELLANEOUS) IMPLANT
CANISTER SUCT 1200ML W/VALVE (MISCELLANEOUS) ×3 IMPLANT
CHLORAPREP W/TINT 26 (MISCELLANEOUS) ×3 IMPLANT
CLIP VESOCCLUDE SM WIDE 6/CT (CLIP) ×2 IMPLANT
CLOSURE WOUND 1/4X4 (GAUZE/BANDAGES/DRESSINGS)
COVER BACK TABLE REUSABLE LG (DRAPES) ×3 IMPLANT
COVER MAYO STAND REUSABLE (DRAPES) ×3 IMPLANT
COVER PROBE W GEL 5X96 (DRAPES) ×3 IMPLANT
COVER WAND RF STERILE (DRAPES) IMPLANT
DECANTER SPIKE VIAL GLASS SM (MISCELLANEOUS) ×2 IMPLANT
DERMABOND ADVANCED (GAUZE/BANDAGES/DRESSINGS) ×2
DERMABOND ADVANCED .7 DNX12 (GAUZE/BANDAGES/DRESSINGS) ×1 IMPLANT
DRAPE HALF SHEET 70X43 (DRAPES) IMPLANT
DRAPE LAPAROTOMY 100X72 PEDS (DRAPES) ×3 IMPLANT
DRAPE UTILITY XL STRL (DRAPES) ×3 IMPLANT
DRSG PAD ABDOMINAL 8X10 ST (GAUZE/BANDAGES/DRESSINGS) ×2 IMPLANT
ELECT COATED BLADE 2.86 ST (ELECTRODE) ×3 IMPLANT
ELECT REM PT RETURN 9FT ADLT (ELECTROSURGICAL) ×3
ELECTRODE REM PT RTRN 9FT ADLT (ELECTROSURGICAL) ×1 IMPLANT
GAUZE SPONGE 4X4 12PLY STRL (GAUZE/BANDAGES/DRESSINGS) ×2 IMPLANT
GAUZE SPONGE 4X4 12PLY STRL LF (GAUZE/BANDAGES/DRESSINGS) IMPLANT
GLOVE BIOGEL PI IND STRL 7.0 (GLOVE) IMPLANT
GLOVE BIOGEL PI IND STRL 7.5 (GLOVE) IMPLANT
GLOVE BIOGEL PI INDICATOR 7.0 (GLOVE) ×2
GLOVE BIOGEL PI INDICATOR 7.5 (GLOVE) ×2
GLOVE SURG SS PI 7.5 STRL IVOR (GLOVE) ×2 IMPLANT
GLOVE SURG SYN 7.5  E (GLOVE) ×2
GLOVE SURG SYN 7.5 E (GLOVE) ×1 IMPLANT
GLOVE SURG SYN 7.5 PF PI (GLOVE) ×1 IMPLANT
GOWN STRL REUS W/ TWL LRG LVL3 (GOWN DISPOSABLE) ×1 IMPLANT
GOWN STRL REUS W/ TWL XL LVL3 (GOWN DISPOSABLE) ×1 IMPLANT
GOWN STRL REUS W/TWL LRG LVL3 (GOWN DISPOSABLE)
GOWN STRL REUS W/TWL XL LVL3 (GOWN DISPOSABLE) ×6
KIT MARKER MARGIN INK (KITS) ×3 IMPLANT
NDL HYPO 25X1 1.5 SAFETY (NEEDLE) ×1 IMPLANT
NEEDLE HYPO 25X1 1.5 SAFETY (NEEDLE) ×3 IMPLANT
NS IRRIG 1000ML POUR BTL (IV SOLUTION) IMPLANT
PACK BASIN DAY SURGERY FS (CUSTOM PROCEDURE TRAY) ×3 IMPLANT
PENCIL BUTTON HOLSTER BLD 10FT (ELECTRODE) ×3 IMPLANT
SLEEVE SCD COMPRESS KNEE MED (MISCELLANEOUS) ×3 IMPLANT
SPONGE LAP 18X18 RF (DISPOSABLE) ×3 IMPLANT
STRIP CLOSURE SKIN 1/4X4 (GAUZE/BANDAGES/DRESSINGS) IMPLANT
SUT MNCRL AB 4-0 PS2 18 (SUTURE) ×3 IMPLANT
SUT VICRYL 3-0 CR8 SH (SUTURE) ×3 IMPLANT
SYR CONTROL 10ML LL (SYRINGE) ×3 IMPLANT
TOWEL GREEN STERILE FF (TOWEL DISPOSABLE) ×3 IMPLANT
TRAY FAXITRON CT DISP (TRAY / TRAY PROCEDURE) ×3 IMPLANT
TUBE CONNECTING 20'X1/4 (TUBING) ×1
TUBE CONNECTING 20X1/4 (TUBING) ×2 IMPLANT
YANKAUER SUCT BULB TIP NO VENT (SUCTIONS) ×3 IMPLANT

## 2019-02-14 NOTE — Op Note (Addendum)
02/14/2019  3:21 PM  PATIENT:  Faith Mcmahon DOB: September 13, 1940 MRN: [id number]  PREOP DIAGNOSIS:   LOBULAR CARCINOMA IN SITU OF LEFT BREAST  POSTOP DIAGNOSIS:    Lobular carcinoma in situ, 3 o'clock position   PROCEDURE:   Procedure(s):  LEFT BREAST LUMPECTOMY WITH RADIOACTIVE SEED LOCALIZATION  SURGEON:   Alphonsa Overall, M.D.  ANESTHESIA:   General  Anesthesiologist: Krystena Rainwater, MD CRNA: Blocker, Ernesta Amble, CRNA  General  EBL:  50  ml  DRAINS:  none   LOCAL MEDICATIONS USED:   30 cc 1/4% marcaine  SPECIMEN:   Left breast lumpectomy  COUNTS CORRECT:  YES  INDICATIONS FOR PROCEDURE:  Faith Mcmahon is a 78 y.o. (DOB: Oct 23, 1940) white female whose primary care physician is Deland Pretty, MD and comes for left breast lumpectomy.   She had a biopsy at [facility] on 11/18/2018 for microca++ that showed LCIS.  She now comes for a wider excision of that area of the breast.   The indications and potential complications of surgery were explained to the patient. Potential complications include, but are not limited to, bleeding, infection, the need for further surgery, and nerve injury.     She had a I131 seed placed on 02/13/2019 in her left breast at [facility].  The seed is in the 3 o'clock position of the left breast.     OPERATIVE NOTE:   The patient was taken to operating room # 5 at [facility] where she underwent a general anesthesia  supervised by Anesthesiologist: Preslei Rainwater, MD CRNA: Blocker, Ernesta Amble, CRNA. Her left breast and axilla were prepped with  ChloraPrep and sterilely draped.    A time-out and the surgical check list was reviewed.    The LCIS was about at the 3 o'clock position of the left breast.   It was 9 cm from the areola, out towards the axilla.  I made a radial incision over the seed.   I used the Neoprobe to identify the I131 seed.  I tried to excise an area around the tumor of at least 1 cm.    I excised this block of breast tissue approximately 3  cm by 3 cm  in diameter.   I painted the lumpectomy specimen with the 6 color paint kit and did a specimen mammogram which confirmed the mass, clip, and the seed were all in the right position in the specimen.  The specimen was sent to pathology who called back to confirm that they have the seed and the specimen.   I then irrigated the wound with saline. I infiltrated approximately 30 mL of 1/4% Marcaine in the incision.   I then closed the wound in layers using 3-0 Vicryl sutures for the deep layer. At the skin, I closed the incisions with a 4-0 Monocryl suture. The incision was then painted with Dermabond.  She had gauze placed over the wound and her breast placed in a breast binder.   The patient tolerated the procedure well, was transported to the recovery room in good condition. Sponge and needle count were correct at the end of the case.   Final pathology is pending.    Left breast lumpectomy   Alphonsa Overall, MD, [facility] Pager: [phone] Office phone:  [phone]

## 2019-02-14 NOTE — Anesthesia Postprocedure Evaluation (Signed)
Anesthesia Post Note  Patient: Aronda [facility]  Procedure(s) Performed: LEFT BREAST LUMPECTOMY WITH RADIOACTIVE SEED LOCALIZATION (Left Breast)     Patient location during evaluation: PACU Anesthesia Type: General Level of consciousness: awake and alert Pain management: pain level controlled Vital Signs Assessment: post-procedure vital signs reviewed and stable Respiratory status: spontaneous breathing, nonlabored ventilation and respiratory function stable Cardiovascular status: blood pressure returned to baseline and stable Postop Assessment: no apparent nausea or vomiting Anesthetic complications: no    Last Vitals:  Vitals:   02/14/19 1528 02/14/19 1530  BP: (!) 145/60   Pulse: 87 87  Resp: 13 14  Temp: 36.6 C   SpO2: 100% 100%    Last Pain:  Vitals:   02/14/19 1528  TempSrc:   PainSc: 0-No pain                 Amra Rainwater

## 2019-02-14 NOTE — Transfer of Care (Signed)
Immediate Anesthesia Transfer of Care Note  Patient: Faith Mcmahon [address] [employer]  Procedure(s) Performed: LEFT BREAST LUMPECTOMY WITH RADIOACTIVE SEED LOCALIZATION (Left Breast)  Patient Location: PACU  Anesthesia Type:General  Level of Consciousness: awake, alert , oriented and patient cooperative  Airway & Oxygen Therapy: Patient Spontanous Breathing and Patient connected to face mask oxygen  Post-op Assessment: Report given to RN and Post -op Vital signs reviewed and stable  Post vital signs: Reviewed and stable  Last Vitals:  Vitals Value Taken Time  BP    Temp    Pulse 85 02/14/19 1526  Resp 16 02/14/19 1526  SpO2 100 % 02/14/19 1526  Vitals shown include unvalidated device data.  Last Pain:  Vitals:   02/14/19 1311  TempSrc: Oral  PainSc: 0-No pain         Complications: No apparent anesthesia complications

## 2019-02-14 NOTE — Interval H&P Note (Signed)
History and Physical Interval Note:  02/14/2019 1:49 PM  Faith Mcmahon  has presented today for surgery, with the diagnosis of LOBULAR CARCINOMA IN SITU OF LEFT BREAST.  The various methods of treatment have been discussed with the patient and family.   Her daughter is here with her.  After consideration of risks, benefits and other options for treatment, the patient has consented to  Procedure(s): LEFT BREAST LUMPECTOMY WITH RADIOACTIVE SEED LOCALIZATION (Left) as a surgical intervention.  The patient's history has been reviewed, patient examined, no change in status, stable for surgery.  I have reviewed the patient's chart and labs.  Questions were answered to the patient's satisfaction.     Shann Medal

## 2019-02-14 NOTE — Anesthesia Procedure Notes (Signed)
Procedure Name: LMA Insertion Date/Time: 02/14/2019 2:13 PM Performed by: Signe Colt, CRNA Pre-anesthesia Checklist: Patient identified, Emergency Drugs available, Suction available and Patient being monitored Patient Re-evaluated:Patient Re-evaluated prior to induction Oxygen Delivery Method: Circle system utilized Preoxygenation: Pre-oxygenation with 100% oxygen Induction Type: IV induction Ventilation: Mask ventilation without difficulty LMA: LMA inserted LMA Size: 4.0 Number of attempts: 1 Airway Equipment and Method: Bite block Placement Confirmation: positive ETCO2 Tube secured with: Tape Dental Injury: Teeth and Oropharynx as per pre-operative assessment

## 2019-02-14 NOTE — Discharge Instructions (Signed)
[facility] - DISCHARGE INSTRUCTIONS TO PATIENT  Activity:  Driving - May drive in 2 to 3 days, if doing well and off pain meds   Lifting - No lifting more than 15 pounds for 5 days, then no limit                       Practice you Covid-19 protection:  Wear a mask, social distance, and wash your hands frequently  Wound Care:   Leave the incision dry for 2 days, then you may shower  Diet:  As tolerated  Follow up appointment:  Call Dr. Pollie Friar office [facility]) at [phone] for an appointment in 2 to 3 weeks.  Medications and dosages:  Resume your home medications.  You have a prescription for:  Ultram  Call Dr. [name] or his office  [phone]) if you have:  Temperature greater than 100.4,  Severe uncontrolled pain,  Redness, tenderness, or signs of infection (pain, swelling, redness, odor or green/yellow discharge around the site),  Any other questions or concerns you may have after discharge.  In an emergency, call 911 or go to an Emergency Department at a nearby hospital.       Post Anesthesia Home Care Instructions  Activity: Get plenty of rest for the remainder of the day. A responsible individual must stay with you for 24 hours following the procedure.  For the next 24 hours, DO NOT: -Drive a car -Paediatric nurse -Drink alcoholic beverages -Take any medication unless instructed by your physician -Make any legal decisions or sign important papers.  Meals: Start with liquid foods such as gelatin or soup. Progress to regular foods as tolerated. Avoid greasy, spicy, heavy foods. If nausea and/or vomiting occur, drink only clear liquids until the nausea and/or vomiting subsides. Call your physician if vomiting continues.  Special Instructions/Symptoms: Your throat may feel dry or sore from the anesthesia or the breathing tube placed in your throat during surgery. If this causes discomfort, gargle with warm salt water. The  discomfort should disappear within 24 hours.  If you had a scopolamine patch placed behind your ear for the management of post- operative nausea and/or vomiting:  1. The medication in the patch is effective for 72 hours, after which it should be removed.  Wrap patch in a tissue and discard in the trash. Wash hands thoroughly with soap and water. 2. You may remove the patch earlier than 72 hours if you experience unpleasant side effects which may include dry mouth, dizziness or visual disturbances. 3. Avoid touching the patch. Wash your hands with soap and water after contact with the patch.

## 2019-02-14 NOTE — Anesthesia Procedure Notes (Signed)
Performed by: Teah Votaw D, CRNA       

## 2019-02-14 NOTE — Anesthesia Preprocedure Evaluation (Signed)
Anesthesia Evaluation  Patient identified by MRN, date of birth, ID band Patient awake    Reviewed: Allergy & Precautions, NPO status , Patient's Chart, lab work & pertinent test results  History of Anesthesia Complications (+) PONV  Airway Mallampati: II  TM Distance: >3 FB Neck ROM: Full    Dental no notable dental hx.    Pulmonary neg pulmonary ROS,    Pulmonary exam normal breath sounds clear to auscultation       Cardiovascular hypertension, negative cardio ROS Normal cardiovascular exam Rhythm:Regular Rate:Normal     Neuro/Psych negative neurological ROS  negative psych ROS   GI/Hepatic Neg liver ROS, GERD  ,  Endo/Other  Hypothyroidism   Renal/GU negative Renal ROS  negative genitourinary   Musculoskeletal negative musculoskeletal ROS (+)   Abdominal   Peds negative pediatric ROS (+)  Hematology negative hematology ROS (+)   Anesthesia Other Findings Breast Cancer  Reproductive/Obstetrics negative OB ROS                             Anesthesia Physical Anesthesia Plan  ASA: III  Anesthesia Plan: General   Post-op Pain Management:    Induction: Intravenous  PONV Risk Score and Plan: 4 or greater and Ondansetron, Dexamethasone, Midazolam, Scopolamine patch - Pre-op and Treatment may vary due to age or medical condition  Airway Management Planned: LMA  Additional Equipment:   Intra-op Plan:   Post-operative Plan: Extubation in OR  Informed Consent: I have reviewed the patients History and Physical, chart, labs and discussed the procedure including the risks, benefits and alternatives for the proposed anesthesia with the patient or authorized representative who has indicated his/her understanding and acceptance.     Dental advisory given  Plan Discussed with: CRNA  Anesthesia Plan Comments:         Anesthesia Quick Evaluation

## 2019-02-18 ENCOUNTER — Encounter (HOSPITAL_BASED_OUTPATIENT_CLINIC_OR_DEPARTMENT_OTHER): Payer: Self-pay | Admitting: Surgery

## 2019-03-10 ENCOUNTER — Telehealth: Payer: Self-pay | Admitting: Hematology and Oncology

## 2019-03-10 NOTE — Telephone Encounter (Signed)
Received a new patient referral from Dr. Alphonsa Overall at [facility] for LCIS. Faith Mcmahon has been cld and scheduled to see Dr. Lindi Adie on 10/8 at 230pm. She's been made aware to arrive 15 minutes early.

## 2019-03-19 NOTE — Progress Notes (Signed)
[facility] CONSULT NOTE  Patient Care Team: Deland Pretty, MD as PCP - General (Internal Medicine) Lorelle Gibbs, MD as Consulting Physician (Radiology)  CHIEF COMPLAINTS/PURPOSE OF CONSULTATION:  Newly diagnosed breast cancer  HISTORY OF PRESENTING ILLNESS:  Faith Mcmahon 78 y.o. female is here because of recent diagnosis of lobular carcinoma in situ of the left breast. Biopsy on 11/18/18 showed LCIS with calcifications. Breast MRI on 01/14/19 showed a single area of non-mass enhancement in the later left breast. She underwent a left lumpectomy with Dr. [name] on 02/14/19 for which pathology showed LCIS, 0.7cm, with no evidence of invasive carcinoma. She presents to the clinic today for initial evaluation and discussion of treatment options.   I reviewed her records extensively and collaborated the history with the patient.    MEDICAL HISTORY:  Past Medical History:  Diagnosis Date  . Cancer ([place]) 12/2018   left breast cancer  . Cerebrovascular disease   . Complication of anesthesia    with hysterectomy in 1980  . GERD (gastroesophageal reflux disease)   . Hypertension   . Hypothyroidism   . Obesity   . Palpitations   . PONV (postoperative nausea and vomiting)     SURGICAL HISTORY: Past Surgical History:  Procedure Laterality Date  . ADENOIDECTOMY    . BLADDER SURGERY    . BREAST LUMPECTOMY WITH RADIOACTIVE SEED LOCALIZATION Left 02/14/2019   Procedure: LEFT BREAST LUMPECTOMY WITH RADIOACTIVE SEED LOCALIZATION;  Surgeon: Alphonsa Overall, MD;  Location: [facility];  Service: General;  Laterality: Left;  . CHOLECYSTECTOMY    . ROTATOR CUFF REPAIR    . TONSILLECTOMY      SOCIAL HISTORY: Social History   Socioeconomic History  . Marital status: Widowed    Spouse name: Not on file  . Number of children: Not on file  . Years of education: Not on file  . Highest education level: Not on file  Occupational History  . Not on file   Social Needs  . Financial resource strain: Not on file  . Food insecurity    Worry: Not on file    Inability: Not on file  . Transportation needs    Medical: Not on file    Non-medical: Not on file  Tobacco Use  . Smoking status: Never Smoker  . Smokeless tobacco: Never Used  Substance and Sexual Activity  . Alcohol use: No  . Drug use: No  . Sexual activity: Not Currently    Birth control/protection: Post-menopausal  Lifestyle  . Physical activity    Days per week: Not on file    Minutes per session: Not on file  . Stress: Not on file  Relationships  . Social Herbalist on phone: Not on file    Gets together: Not on file    Attends religious service: Not on file    Active member of club or organization: Not on file    Attends meetings of clubs or organizations: Not on file    Relationship status: Not on file  . Intimate partner violence    Fear of current or ex partner: Not on file    Emotionally abused: Not on file    Physically abused: Not on file    Forced sexual activity: Not on file  Other Topics Concern  . Not on file  Social History Narrative  . Not on file    FAMILY HISTORY: Family History  Problem Relation Age of Onset  . Heart  failure Other     ALLERGIES:  is allergic to penicillins; ciprofloxacin; nsaids; prolia [denosumab]; and tetanus toxoids.  MEDICATIONS:  Current Outpatient Medications  Medication Sig Dispense Refill  . aspirin 81 MG tablet Take 81 mg by mouth daily.    . bisoprolol-hydrochlorothiazide (ZIAC) 5-6.25 MG tablet Take 0.5 tablets by mouth daily.    . calcium carbonate 200 MG capsule Take 250 mg by mouth 2 (two) times daily with a meal.    . cholecalciferol (VITAMIN D) 1000 UNITS tablet Take 1,000 Units by mouth daily.    Marland Kitchen CRANBERRY FRUIT PO Take 1 tablet by mouth 2 (two) times daily.    Marland Kitchen levothyroxine (SYNTHROID, LEVOTHROID) 50 MCG tablet SAT- SUN    . levothyroxine (SYNTHROID, LEVOTHROID) 75 MCG tablet MON- FRI    .  Methylcellulose, Laxative, (CITRUCEL PO) Take 2 tablets by mouth daily.    . Multiple Vitamin (MULTIVITAMIN) capsule Take 1 capsule by mouth daily.    Marland Kitchen omeprazole (PRILOSEC) 20 MG capsule Take 20 mg by mouth daily.    . Probiotic Product (PROBIOTIC ADVANCED PO) Take 1 capsule by mouth 2 (two) times daily.    . rosuvastatin (CRESTOR) 10 MG tablet Take 10 mg by mouth every other day.     . tamoxifen (NOLVADEX) 10 MG tablet Take 1 tablet (10 mg total) by mouth daily. 90 tablet 3  . traMADol (ULTRAM) 50 MG tablet Take 1 tablet (50 mg total) by mouth every 6 (six) hours as needed for moderate pain or severe pain. 15 tablet 0  . vitamin B-12 (CYANOCOBALAMIN) 1000 MCG tablet Take 1,000 mcg by mouth daily.     No current facility-administered medications for this visit.     REVIEW OF SYSTEMS:   Constitutional: Denies fevers, chills or abnormal night sweats Eyes: Denies blurriness of vision, double vision or watery eyes Ears, nose, mouth, throat, and face: Denies mucositis or sore throat Respiratory: Denies cough, dyspnea or wheezes Cardiovascular: Denies palpitation, chest discomfort or lower extremity swelling Gastrointestinal:  Denies nausea, heartburn or change in bowel habits Skin: Denies abnormal skin rashes Lymphatics: Denies new lymphadenopathy or easy bruising Neurological:Denies numbness, tingling or new weaknesses Behavioral/Psych: Mood is stable, no new changes  Breast: Denies any palpable lumps or discharge All other systems were reviewed with the patient and are negative.  PHYSICAL EXAMINATION: ECOG PERFORMANCE STATUS: 0 - Asymptomatic  Vitals:   03/20/19 1432  BP: (!) 162/65  Pulse: 72  Resp: 18  Temp: 98.7 F (37.1 C)  SpO2: 98%   Filed Weights   03/20/19 1432  Weight: 150 lb (68 kg)    GENERAL:alert, no distress and comfortable SKIN: skin color, texture, turgor are normal, no rashes or significant lesions EYES: normal, conjunctiva are pink and non-injected,  sclera clear OROPHARYNX:no exudate, no erythema and lips, buccal mucosa, and tongue normal  NECK: supple, thyroid normal size, non-tender, without nodularity LYMPH:  no palpable lymphadenopathy in the cervical, axillary or inguinal LUNGS: clear to auscultation and percussion with normal breathing effort HEART: regular rate & rhythm and no murmurs and no lower extremity edema ABDOMEN:abdomen soft, non-tender and normal bowel sounds Musculoskeletal:no cyanosis of digits and no clubbing  PSYCH: alert & oriented x 3 with fluent speech NEURO: no focal motor/sensory deficits    LABORATORY DATA:  I have reviewed the data as listed No results found for: WBC, HGB, HCT, MCV, PLT Lab Results  Component Value Date   NA 139 02/11/2019   K 4.0 02/11/2019   CL 102  02/11/2019   CO2 28 02/11/2019    RADIOGRAPHIC STUDIES: I have personally reviewed the radiological reports and agreed with the findings in the report.  ASSESSMENT AND PLAN:  Lobular carcinoma in situ (LCIS) of left breast Pathology counseling: I discussed with her the difference between LCIS and invasive cancer. LCIS is a risk factor for breast cancer and not a premalignant condition. Hence it does not need to be resected. However she is at higher than normal risk of breast cancer. Risk of invasive and noninvasive breast cancer with LCIS is 1% per year. Her cumulative risk lifetime would be around 23.4% based upon [employer] model . Tamoxifen would reduce this risk by half. This is based on NSABP P-1 clinical trial  Risk reduction strategies: 1. Tamoxifen or raloxifene are indicated to decrease the risk of another noninvasive or invasive breast cancer. Patient fully understands that neither of these medications would prolong her life but certainly help decrease recurrence rate by 50% . 2. Recommended annual mammograms and breast MRIs  Next mammogram will be in May 2021.  Breast MRI will be in November 2021.  Tamoxifen toxicities:We  discussed the risks and benefits of tamoxifen. These include but not limited to insomnia, hot flashes, mood changes, vaginal dryness, and weight gain. Although rare, serious side effects including endometrial cancer, risk of blood clots were also discussed. We strongly believe that the benefits far outweigh the risks. Patient understands these risks and consented to starting treatment. Planned treatment duration is 5 years.  I give her a prescription of 10 mg tamoxifen.  I discussed with her that even at 10 mg tamoxifen has been found to be very effective. Return to clinic in 3 months for toxicity check and follow-up.  All questions were answered. The patient knows to call the clinic with any problems, questions or concerns.   Rulon Eisenmenger, MD, MPH 03/20/2019    I, Molly Dorshimer, am acting as scribe for Nicholas Lose, MD.  I have reviewed the above documentation for accuracy and completeness, and I agree with the above.

## 2019-03-20 ENCOUNTER — Other Ambulatory Visit: Payer: Self-pay

## 2019-03-20 ENCOUNTER — Inpatient Hospital Stay: Payer: Medicare Other | Attending: Hematology and Oncology | Admitting: Hematology and Oncology

## 2019-03-20 DIAGNOSIS — I10 Essential (primary) hypertension: Secondary | ICD-10-CM | POA: Diagnosis not present

## 2019-03-20 DIAGNOSIS — D0502 Lobular carcinoma in situ of left breast: Secondary | ICD-10-CM | POA: Insufficient documentation

## 2019-03-20 DIAGNOSIS — E669 Obesity, unspecified: Secondary | ICD-10-CM | POA: Insufficient documentation

## 2019-03-20 DIAGNOSIS — Z7982 Long term (current) use of aspirin: Secondary | ICD-10-CM | POA: Diagnosis not present

## 2019-03-20 DIAGNOSIS — Z79899 Other long term (current) drug therapy: Secondary | ICD-10-CM | POA: Insufficient documentation

## 2019-03-20 DIAGNOSIS — Z1231 Encounter for screening mammogram for malignant neoplasm of breast: Secondary | ICD-10-CM

## 2019-03-20 DIAGNOSIS — E039 Hypothyroidism, unspecified: Secondary | ICD-10-CM | POA: Diagnosis not present

## 2019-03-20 DIAGNOSIS — Z90710 Acquired absence of both cervix and uterus: Secondary | ICD-10-CM | POA: Diagnosis not present

## 2019-03-20 MED ORDER — TAMOXIFEN CITRATE 10 MG PO TABS: 10.0000 mg | ORAL_TABLET | Freq: Every day | ORAL | 3 refills | Status: DC

## 2019-03-20 NOTE — Assessment & Plan Note (Signed)
Pathology counseling: I discussed with her the difference between LCIS and invasive cancer. LCIS is a risk factor for breast cancer and not a premalignant condition. Hence it does not need to be resected. However she is at higher than normal risk of breast cancer. Risk of invasive and noninvasive breast cancer with LCIS is 1% per year. Her cumulative risk lifetime would be around **%. Tamoxifen would reduce this risk by half. This is based on NSABP P-1 clinical trial  Risk reduction strategies: 1. Tamoxifen or raloxifene are indicated to decrease the risk of another noninvasive or invasive breast cancer. Patient fully understands that neither of these medications would prolong her life but certainly help decrease recurrence rate by 50% . 2. Recommended annual mammograms and breast exams.   Tamoxifen toxicities:We discussed the risks and benefits of tamoxifen. These include but not limited to insomnia, hot flashes, mood changes, vaginal dryness, and weight gain. Although rare, serious side effects including endometrial cancer, risk of blood clots were also discussed. We strongly believe that the benefits far outweigh the risks. Patient understands these risks and consented to starting treatment. Planned treatment duration is 5 years.

## 2019-03-21 ENCOUNTER — Telehealth: Payer: Self-pay | Admitting: Hematology and Oncology

## 2019-03-21 NOTE — Telephone Encounter (Signed)
I talk with patient regarding schedule  

## 2019-03-26 DIAGNOSIS — I10 Essential (primary) hypertension: Secondary | ICD-10-CM | POA: Diagnosis not present

## 2019-03-26 DIAGNOSIS — Z23 Encounter for immunization: Secondary | ICD-10-CM | POA: Diagnosis not present

## 2019-03-26 DIAGNOSIS — R000 Tachycardia, unspecified: Secondary | ICD-10-CM | POA: Diagnosis not present

## 2019-03-28 DIAGNOSIS — I10 Essential (primary) hypertension: Secondary | ICD-10-CM | POA: Diagnosis not present

## 2019-04-16 ENCOUNTER — Encounter: Payer: Self-pay | Admitting: Cardiology

## 2019-04-16 ENCOUNTER — Ambulatory Visit: Payer: Medicare Other | Admitting: Cardiology

## 2019-04-16 ENCOUNTER — Other Ambulatory Visit: Payer: Self-pay

## 2019-04-16 VITALS — BP 160/90 | HR 65 | Ht 66.0 in | Wt 148.0 lb

## 2019-04-16 DIAGNOSIS — I679 Cerebrovascular disease, unspecified: Secondary | ICD-10-CM | POA: Diagnosis not present

## 2019-04-16 DIAGNOSIS — I10 Essential (primary) hypertension: Secondary | ICD-10-CM | POA: Diagnosis not present

## 2019-04-16 DIAGNOSIS — R0989 Other specified symptoms and signs involving the circulatory and respiratory systems: Secondary | ICD-10-CM | POA: Diagnosis not present

## 2019-04-16 DIAGNOSIS — R002 Palpitations: Secondary | ICD-10-CM | POA: Diagnosis not present

## 2019-04-16 MED ORDER — LOSARTAN POTASSIUM 50 MG PO TABS: 50.0000 mg | ORAL_TABLET | Freq: Every day | ORAL | 3 refills | Status: DC

## 2019-04-16 NOTE — Progress Notes (Signed)
HPI: FU hypertension. Echocardiogram in August of 2011 showed normal LV systolic function, mild left ventricular hypertrophy with diastolic dysfunction and mild tricuspid regurgitation. There was trace mitral regurgitation. Previous holter monitor in August of 2011 showed no significant arrhythmia. Carotid Dopplers 6/18showed 1-39% bilateral stenosis; fu recommended in 2 years. Exercise treadmill June 2018 showed 2 mm of ST depression in the inferior and lateral leads and chest pain. Study felt to be abnormal.Cardiac CTA August 2018 showed no evidence of coronary disease and calcium score 0.Since last seen,she denies dyspnea, chest pain or syncope.  Her blood pressure has been elevated.  She has also had 2 episodes of palpitations described as heart racing.  Symptoms last approximately 2 minutes and resolve spontaneously.  Current Outpatient Medications  Medication Sig Dispense Refill  . aspirin 81 MG tablet Take 81 mg by mouth daily.    . bisoprolol-hydrochlorothiazide (ZIAC) 5-6.25 MG tablet Take 1 tablet by mouth daily.     . calcium carbonate 200 MG capsule Take 250 mg by mouth 2 (two) times daily with a meal.    . cholecalciferol (VITAMIN D) 1000 UNITS tablet Take 1,000 Units by mouth daily.    Marland Kitchen CRANBERRY FRUIT PO Take 1 tablet by mouth 2 (two) times daily.    Marland Kitchen levothyroxine (SYNTHROID, LEVOTHROID) 50 MCG tablet SAT- SUN    . levothyroxine (SYNTHROID, LEVOTHROID) 75 MCG tablet MON- FRI    . Methylcellulose, Laxative, (CITRUCEL PO) Take 2 tablets by mouth daily.    . Multiple Vitamin (MULTIVITAMIN) capsule Take 1 capsule by mouth daily.    Marland Kitchen omeprazole (PRILOSEC) 20 MG capsule Take 20 mg by mouth daily.    . Probiotic Product (PROBIOTIC ADVANCED PO) Take 1 capsule by mouth 2 (two) times daily.    . rosuvastatin (CRESTOR) 10 MG tablet Take 10 mg by mouth every other day.     . tamoxifen (NOLVADEX) 10 MG tablet Take 1 tablet (10 mg total) by mouth daily. 90 tablet 3  . vitamin B-12  (CYANOCOBALAMIN) 1000 MCG tablet Take 1,000 mcg by mouth daily.     No current facility-administered medications for this visit.      Past Medical History:  Diagnosis Date  . Cancer ([place]) 12/2018   left breast cancer  . Cerebrovascular disease   . Complication of anesthesia    with hysterectomy in 1980  . GERD (gastroesophageal reflux disease)   . Hypertension   . Hypothyroidism   . Obesity   . Palpitations   . PONV (postoperative nausea and vomiting)     Past Surgical History:  Procedure Laterality Date  . ADENOIDECTOMY    . BLADDER SURGERY    . BREAST LUMPECTOMY WITH RADIOACTIVE SEED LOCALIZATION Left 02/14/2019   Procedure: LEFT BREAST LUMPECTOMY WITH RADIOACTIVE SEED LOCALIZATION;  Surgeon: Alphonsa Overall, MD;  Location: [facility];  Service: General;  Laterality: Left;  . CHOLECYSTECTOMY    . ROTATOR CUFF REPAIR    . TONSILLECTOMY      Social History   Socioeconomic History  . Marital status: Widowed    Spouse name: Not on file  . Number of children: Not on file  . Years of education: Not on file  . Highest education level: Not on file  Occupational History  . Not on file  Social Needs  . Financial resource strain: Not on file  . Food insecurity    Worry: Not on file    Inability: Not on file  . Transportation needs  Medical: Not on file    Non-medical: Not on file  Tobacco Use  . Smoking status: Never Smoker  . Smokeless tobacco: Never Used  Substance and Sexual Activity  . Alcohol use: No  . Drug use: No  . Sexual activity: Not Currently    Birth control/protection: Post-menopausal  Lifestyle  . Physical activity    Days per week: Not on file    Minutes per session: Not on file  . Stress: Not on file  Relationships  . Social Herbalist on phone: Not on file    Gets together: Not on file    Attends religious service: Not on file    Active member of club or organization: Not on file    Attends meetings of clubs or  organizations: Not on file    Relationship status: Not on file  . Intimate partner violence    Fear of current or ex partner: Not on file    Emotionally abused: Not on file    Physically abused: Not on file    Forced sexual activity: Not on file  Other Topics Concern  . Not on file  Social History Narrative  . Not on file    Family History  Problem Relation Age of Onset  . Heart failure Other     ROS: no fevers or chills, productive cough, hemoptysis, dysphasia, odynophagia, melena, hematochezia, dysuria, hematuria, rash, seizure activity, orthopnea, PND, pedal edema, claudication. Remaining systems are negative.  Physical Exam: Well-developed well-nourished in no acute distress.  Skin is warm and dry.  HEENT is normal.  Neck is supple.  Left carotid bruit. Chest is clear to auscultation with normal expansion.  Cardiovascular exam is regular rate and rhythm.  Abdominal exam nontender or distended. No masses palpated. Extremities show no edema. neuro grossly intact  ECG-sinus rhythm with no ST changes.  Personally reviewed  A/P  1 hypertension-patient's blood pressure is elevated.  Add losartan 50 mg daily and adjust as needed.  Check potassium and renal function in 1 week.  2 hyperlipidemia-continue statin.  3 history of abnormal exercise treadmill-follow-up CTA with calcium score 0 and no coronary disease.  4 carotid artery disease-repeat carotid Dopplers.  Continue aspirin and statin.  5 palpitations-2 recent episodes of palpitations of uncertain etiology.  We will follow for now and continue beta-blocker.  If she has more frequent episodes we will recommend an event monitor.  Kirk Ruths, MD

## 2019-04-16 NOTE — Patient Instructions (Signed)
Medication Instructions:  START LOSARTAN 50 MG ONCE DAILY  *If you need a refill on your cardiac medications before your next appointment, please call your pharmacy*  Lab Work: Your physician recommends that you return for [place]  If you have labs (blood work) drawn today and your tests are completely normal, you will receive your results only by: Marland Kitchen MyChart Message (if you have MyChart) OR . A paper copy in the mail If you have any lab test that is abnormal or we need to change your treatment, we will call you to review the results.  Testing/Procedures: Your physician has requested that you have a carotid duplex. This test is an ultrasound of the carotid arteries in your neck. It looks at blood flow through these arteries that supply the brain with blood. Allow one hour for this exam. There are no restrictions or special instructions.[facility]    Follow-Up: At [facility], you and your health needs are our priority.  As part of our continuing mission to provide you with exceptional heart care, we have created designated Provider Care Teams.  These Care Teams include your primary Cardiologist (physician) and Advanced Practice Providers (APPs -  Physician Assistants and Nurse Practitioners) who all work together to provide you with the care you need, when you need it.  Your next appointment:   6 months  The format for your next appointment:   Either In Person or Virtual  Provider:   You may see Kirk Ruths MD or one of the following Advanced Practice Providers on your designated Care Team:    Kerin Ransom, PA-C  [place], [place]  Coletta Memos, [place]

## 2019-04-22 ENCOUNTER — Ambulatory Visit (HOSPITAL_COMMUNITY)
Admission: RE | Admit: 2019-04-22 | Payer: Medicare Other | Source: Ambulatory Visit | Attending: Cardiology | Admitting: Cardiology

## 2019-04-23 DIAGNOSIS — R002 Palpitations: Secondary | ICD-10-CM | POA: Diagnosis not present

## 2019-04-23 DIAGNOSIS — I10 Essential (primary) hypertension: Secondary | ICD-10-CM | POA: Diagnosis not present

## 2019-04-23 LAB — BASIC METABOLIC PANEL
BUN/Creatinine Ratio: 22 (ref 12–28)
BUN: 17 mg/dL (ref 8–27)
CO2: 24 mmol/L (ref 20–29)
Calcium: 9.8 mg/dL (ref 8.7–10.3)
Chloride: 102 mmol/L (ref 96–106)
Creatinine, Ser: 0.78 mg/dL (ref 0.57–1.00)
GFR calc Af Amer: 85 mL/min/{1.73_m2} (ref >59)
GFR calc non Af Amer: 74 mL/min/{1.73_m2} (ref >59)
Glucose: 107 mg/dL — ABNORMAL HIGH (ref 65–99)
Potassium: 4.7 mmol/L (ref 3.5–5.2)
Sodium: 141 mmol/L (ref 134–144)

## 2019-04-24 ENCOUNTER — Telehealth: Payer: Self-pay | Admitting: Cardiology

## 2019-04-24 DIAGNOSIS — I10 Essential (primary) hypertension: Secondary | ICD-10-CM

## 2019-04-24 DIAGNOSIS — R002 Palpitations: Secondary | ICD-10-CM

## 2019-04-24 DIAGNOSIS — Z79899 Other long term (current) drug therapy: Secondary | ICD-10-CM

## 2019-04-24 MED ORDER — BISOPROLOL-HYDROCHLOROTHIAZIDE 5-6.25 MG PO TABS: 1.0000 | ORAL_TABLET | Freq: Every day | ORAL | 2 refills | Status: AC

## 2019-04-24 NOTE — Telephone Encounter (Signed)
Patient should be taking losartan 50mg  daily (new medication on 04/16/2019) and bisoprool/HCZT

## 2019-04-24 NOTE — Telephone Encounter (Signed)
Patient should be taking losartan 50mg  daily (new medication on 04/16/2019) and bisoprolol/HCTZ 5-6.25mg  daily for HTN management.    Per DR Jacalyn Lefevre note "palpitation -will follow for now and continue beta-blocker. Is she has more frequent episodes we will recommend and event monitor"   Recommendation:  1. Due to precense of heart flutters and variable blood pressure, we will recommend follow up next available with cardiologist or APP. Patient may need event monitor per DR Crenshaw's plan.   2. Increase bisoprolol/HCTZ to 2 tablets (10mg /12.5mg ) daily for now and continue to monitor BP and HR.

## 2019-04-24 NOTE — Telephone Encounter (Signed)
Follow BP and HR on new regimen Kirk Ruths

## 2019-04-24 NOTE — Telephone Encounter (Signed)
Returned call to pt she states that she is not taking the bisoprolol/HCTZ she was understanding at the appointment that she was to stop the bisoprolol and start the losartan. She will now re-start the biso/HCTZ5-625mg  as her BP is still running high. She states BP was "OK FOR AWHILE BUT WENT BACK UP". Here are her BP since her LOV Thurs 11-5 AM 135/66 PM 126/67 fri 11-6 Am 117/74 PM  130/64 SAT  11-7 AM  138/75 PM 182/67 SUN 11-8 AM 126/75 PM  147/71 MON 11-9 AM  142/42 PM  169/84 TUES 11-10 AM  133/77 PM  176/82 WED 11-11 AM  169/88 PM  148/77  THURS 11-12 AM  127/78  Hr 96 She states that she did not log her HR so this was not included. She states that she has only had a fe palpitations since LOV only 2-3 times and states that they are of short duration only 2-3 sec. She sates that she cannot "link" them to any certain action except one time she was out walking and she was unable to hat her BP/HR as se was outside her house and they has stopped when she returned home. She will re-start the biso./HCTZ and Losartan and continue to log her BP/HR. She will notate when she has any palpitations and will take BP/HR at that time. She will come back in a week for BMET agin d/t restarting the biso/HCTZ in one week.order entered. Refill sent to requested pharmacy

## 2019-04-24 NOTE — Telephone Encounter (Signed)
New Message  Pt c/o BP issue: STAT if pt c/o blurred vision, one-sided weakness or slurred speech  1. What are your last 5 BP readings? 127/78 HR 96, 182/67 HR 74, 176/82 HR 71, 169/74 HR 72, 169/88 HR 83, 148/77 HR 74  2. Are you having any other symptoms (ex. Dizziness, headache, blurred vision, passed out)? Dizziness, Heart Flutters, and Tired  3. What is your BP issue? BP elevated

## 2019-04-24 NOTE — Addendum Note (Signed)
Addended by: Waylan Rocher on: 04/24/2019 09:53 AM   Modules accepted: Orders

## 2019-04-29 ENCOUNTER — Ambulatory Visit (HOSPITAL_COMMUNITY)
Admission: RE | Admit: 2019-04-29 | Discharge: 2019-04-29 | Disposition: A | Discharge status: Home / Self Care | Payer: Medicare Other | Source: Ambulatory Visit | Attending: Cardiovascular Disease | Admitting: Cardiovascular Disease

## 2019-04-29 ENCOUNTER — Other Ambulatory Visit: Payer: Self-pay

## 2019-04-29 DIAGNOSIS — R0989 Other specified symptoms and signs involving the circulatory and respiratory systems: Secondary | ICD-10-CM | POA: Insufficient documentation

## 2019-05-01 ENCOUNTER — Telehealth: Payer: Self-pay | Admitting: Hematology and Oncology

## 2019-05-01 DIAGNOSIS — H04123 Dry eye syndrome of bilateral lacrimal glands: Secondary | ICD-10-CM | POA: Diagnosis not present

## 2019-05-01 DIAGNOSIS — R002 Palpitations: Secondary | ICD-10-CM | POA: Diagnosis not present

## 2019-05-01 DIAGNOSIS — Z79899 Other long term (current) drug therapy: Secondary | ICD-10-CM | POA: Diagnosis not present

## 2019-05-01 DIAGNOSIS — Z961 Presence of intraocular lens: Secondary | ICD-10-CM | POA: Diagnosis not present

## 2019-05-01 DIAGNOSIS — I10 Essential (primary) hypertension: Secondary | ICD-10-CM | POA: Diagnosis not present

## 2019-05-01 LAB — BASIC METABOLIC PANEL
BUN/Creatinine Ratio: 21 (ref 12–28)
BUN: 18 mg/dL (ref 8–27)
CO2: 26 mmol/L (ref 20–29)
Calcium: 9.8 mg/dL (ref 8.7–10.3)
Chloride: 100 mmol/L (ref 96–106)
Creatinine, Ser: 0.86 mg/dL (ref 0.57–1.00)
GFR calc Af Amer: 76 mL/min/{1.73_m2} (ref >59)
GFR calc non Af Amer: 66 mL/min/{1.73_m2} (ref >59)
Glucose: 142 mg/dL — ABNORMAL HIGH (ref 65–99)
Potassium: 4.8 mmol/L (ref 3.5–5.2)
Sodium: 139 mmol/L (ref 134–144)

## 2019-05-01 NOTE — Telephone Encounter (Signed)
Returned patient's phone call regarding rescheduling 01/08 appointment, per request appointment has moved to 01/07.

## 2019-06-10 ENCOUNTER — Telehealth: Payer: Self-pay | Admitting: Hematology and Oncology

## 2019-06-10 NOTE — Telephone Encounter (Signed)
Rescheduled per MD. Called and left VM. Mailed printout

## 2019-06-19 ENCOUNTER — Ambulatory Visit: Payer: Medicare Other | Admitting: Hematology and Oncology

## 2019-06-20 ENCOUNTER — Ambulatory Visit: Payer: Medicare Other | Admitting: Hematology and Oncology

## 2019-06-23 NOTE — Progress Notes (Signed)
Patient Care Team: Deland Pretty, MD as PCP - General (Internal Medicine) Lorelle Gibbs, MD as Consulting Physician (Radiology)  DIAGNOSIS:    ICD-10-CM   1. Lobular carcinoma in situ (LCIS) of left breast  D05.02     SUMMARY OF ONCOLOGIC HISTORY: Oncology History  Lobular carcinoma in situ (LCIS) of left breast  11/18/2018 Initial Diagnosis   Biopsy on 11/18/18 showed LCIS with calcifications. Breast MRI on 01/14/19 showed a single area of non-mass enhancement in the later left breast.    02/14/2019 Surgery   Left lumpectomy [name]): LCIS, 0.7cm, with no evidence of invasive carcinoma.   03/20/2019 -  Anti-estrogen oral therapy   Tamoxifen 10mg  daily, planned duration of 5 years     CHIEF COMPLIANT: Follow-up of left breast LCIS on tamoxifen  INTERVAL HISTORY: Faith Mcmahon is a 79 y.o. with above-mentioned history of LCIS of the left breast who underwent a lumpectomy and is currently on 10mg  tamoxifen. She presents to the clinic today for 3 month follow-up.   ALLERGIES:  is allergic to penicillins; ciprofloxacin; nsaids; prolia [denosumab]; and tetanus toxoids.  MEDICATIONS:  Current Outpatient Medications  Medication Sig Dispense Refill  . aspirin 81 MG tablet Take 81 mg by mouth daily.    . bisoprolol-hydrochlorothiazide (ZIAC) 5-6.25 MG tablet Take 1 tablet by mouth daily. 90 tablet 2  . calcium carbonate 200 MG capsule Take 250 mg by mouth 2 (two) times daily with a meal.    . cholecalciferol (VITAMIN D) 1000 UNITS tablet Take 1,000 Units by mouth daily.    Marland Kitchen CRANBERRY FRUIT PO Take 1 tablet by mouth 2 (two) times daily.    Marland Kitchen levothyroxine (SYNTHROID, LEVOTHROID) 50 MCG tablet SAT- SUN    . levothyroxine (SYNTHROID, LEVOTHROID) 75 MCG tablet MON- FRI    . losartan (COZAAR) 50 MG tablet Take 1 tablet (50 mg total) by mouth daily. 90 tablet 3  . Methylcellulose, Laxative, (CITRUCEL PO) Take 2 tablets by mouth daily.    . Multiple Vitamin (MULTIVITAMIN) capsule  Take 1 capsule by mouth daily.    Marland Kitchen omeprazole (PRILOSEC) 20 MG capsule Take 20 mg by mouth daily.    . Probiotic Product (PROBIOTIC ADVANCED PO) Take 1 capsule by mouth 2 (two) times daily.    . rosuvastatin (CRESTOR) 10 MG tablet Take 10 mg by mouth every other day.     . tamoxifen (NOLVADEX) 10 MG tablet Take 1 tablet (10 mg total) by mouth daily. 90 tablet 3  . vitamin B-12 (CYANOCOBALAMIN) 1000 MCG tablet Take 1,000 mcg by mouth daily.     No current facility-administered medications for this visit.    PHYSICAL EXAMINATION: ECOG PERFORMANCE STATUS: 1 - Symptomatic but completely ambulatory  There were no vitals filed for this visit. There were no vitals filed for this visit.  LABORATORY DATA:  I have reviewed the data as listed CMP Latest Ref Rng & Units 05/01/2019 04/23/2019 02/11/2019  Glucose 65 - 99 mg/dL 142(H) 107(H) 104(H)  BUN 8 - 27 mg/dL 18 17 13   Creatinine 0.57 - 1.00 mg/dL 0.86 0.78 0.74  Sodium 134 - 144 mmol/L 139 141 139  Potassium 3.5 - 5.2 mmol/L 4.8 4.7 4.0  Chloride 96 - 106 mmol/L 100 102 102  CO2 20 - 29 mmol/L 26 24 28   Calcium 8.7 - 10.3 mg/dL 9.8 9.8 9.6    No results found for: WBC, HGB, HCT, MCV, PLT, NEUTROABS  ASSESSMENT & PLAN:  Lobular carcinoma in situ (LCIS) of left  breast Left lumpectomy with Dr. [name] on 02/14/19 for which pathology showed LCIS, 0.7cm, with no evidence of invasive carcinoma. cumulative risk lifetime would be around 23.4% based upon Tyer Cusick model   Risk reduction: Tamoxifen 10 mg daily started 03/20/2019 discontinued November 2020 because of severe vaginal yeast infection that did not get better in spite of multiple treatments.  It finally got better after she discontinued tamoxifen. Tamoxifen toxicities: Severe yeast infection after discontinuation of tamoxifen. We discussed about anastrozole therapy as less likelihood of causing yeast infection but patient is very reluctant and wishes to not receive any further  antiestrogen therapy at this time.  If she changes her mind she will inform us.  Breast cancer surveillance: Mammogram will be done May 2021 Breast MRI will be done November 2021  Return to clinic in 1 year for follow-up   No orders of the defined types were placed in this encounter.  The patient has a good understanding of the overall plan. she agrees with it. she will call with any problems that may develop before the next visit here.  Total time spent: 20 mins including face to face time and time spent for planning, charting and coordination of care  Nicholas Lose, MD 06/24/2019  I, Cloyde Reams Dorshimer, am acting as scribe for Dr. Nicholas Lose.  I have reviewed the above documentation for accuracy and completeness, and I agree with the above.

## 2019-06-24 ENCOUNTER — Telehealth: Payer: Self-pay | Admitting: Hematology and Oncology

## 2019-06-24 ENCOUNTER — Other Ambulatory Visit: Payer: Self-pay

## 2019-06-24 ENCOUNTER — Inpatient Hospital Stay: Payer: Medicare Other | Attending: Hematology and Oncology | Admitting: Hematology and Oncology

## 2019-06-24 DIAGNOSIS — Z7982 Long term (current) use of aspirin: Secondary | ICD-10-CM | POA: Insufficient documentation

## 2019-06-24 DIAGNOSIS — Z170 Estrogen receptor positive status [ER+]: Secondary | ICD-10-CM | POA: Diagnosis not present

## 2019-06-24 DIAGNOSIS — D0502 Lobular carcinoma in situ of left breast: Secondary | ICD-10-CM

## 2019-06-24 DIAGNOSIS — Z79810 Long term (current) use of selective estrogen receptor modulators (SERMs): Secondary | ICD-10-CM | POA: Diagnosis not present

## 2019-06-24 DIAGNOSIS — Z79899 Other long term (current) drug therapy: Secondary | ICD-10-CM | POA: Insufficient documentation

## 2019-06-24 NOTE — Telephone Encounter (Signed)
Scheduled per 1/12 los. Called and spoke with pt, confirmed 1/18 appt

## 2019-06-24 NOTE — Telephone Encounter (Signed)
Faxed request to [facility] at [phone]

## 2019-06-24 NOTE — Assessment & Plan Note (Signed)
Left lumpectomy with Dr. [name] on 02/14/19 for which pathology showed LCIS, 0.7cm, with no evidence of invasive carcinoma. cumulative risk lifetime would be around 23.4% based upon Tyer Cusick model   Risk reduction: Tamoxifen 10 mg daily started 03/20/2019 Tamoxifen toxicities:  Breast cancer surveillance: Mammogram will be done May 2021 Breast MRI will be done November 2021  Return to clinic in 1 year for follow-up

## 2019-06-26 DIAGNOSIS — I6529 Occlusion and stenosis of unspecified carotid artery: Secondary | ICD-10-CM | POA: Diagnosis not present

## 2019-06-26 DIAGNOSIS — I10 Essential (primary) hypertension: Secondary | ICD-10-CM | POA: Diagnosis not present

## 2019-06-26 DIAGNOSIS — E039 Hypothyroidism, unspecified: Secondary | ICD-10-CM | POA: Diagnosis not present

## 2019-06-30 ENCOUNTER — Other Ambulatory Visit: Payer: Self-pay | Admitting: Genetic Counselor

## 2019-06-30 ENCOUNTER — Inpatient Hospital Stay (HOSPITAL_BASED_OUTPATIENT_CLINIC_OR_DEPARTMENT_OTHER): Payer: Medicare Other | Admitting: Genetic Counselor

## 2019-06-30 ENCOUNTER — Inpatient Hospital Stay: Payer: Medicare Other

## 2019-06-30 ENCOUNTER — Other Ambulatory Visit: Payer: Self-pay

## 2019-06-30 ENCOUNTER — Encounter: Payer: Self-pay | Admitting: Genetic Counselor

## 2019-06-30 DIAGNOSIS — R7301 Impaired fasting glucose: Secondary | ICD-10-CM | POA: Diagnosis not present

## 2019-06-30 DIAGNOSIS — Z803 Family history of malignant neoplasm of breast: Secondary | ICD-10-CM | POA: Diagnosis not present

## 2019-06-30 DIAGNOSIS — I10 Essential (primary) hypertension: Secondary | ICD-10-CM | POA: Diagnosis not present

## 2019-06-30 DIAGNOSIS — D0502 Lobular carcinoma in situ of left breast: Secondary | ICD-10-CM

## 2019-06-30 DIAGNOSIS — Z8041 Family history of malignant neoplasm of ovary: Secondary | ICD-10-CM | POA: Diagnosis not present

## 2019-06-30 NOTE — Progress Notes (Signed)
REFERRING PROVIDER: Nicholas Lose, MD [place],  [place] [address]  PRIMARY PROVIDER:  Deland Pretty, MD  PRIMARY REASON FOR VISIT:  1. Lobular carcinoma in situ (LCIS) of left breast   2. Family history of breast cancer   3. Family history of ovarian cancer      HISTORY OF PRESENT ILLNESS:   Faith Mcmahon, a 79 y.o. female, was seen for a [facility] cancer genetics consultation at the request of Dr. Lindi Adie due to a personal history of a high risk breast lesion and family history of cancer.  Faith Mcmahon presents to clinic today to discuss the possibility of a hereditary predisposition to cancer, genetic testing, and to further clarify her future cancer risks, as well as potential cancer risks for family members.    [name] is a 79 y.o. female with no personal history of cancer.  She was found to have LCIS and has been placed on Tamoxifen.  CANCER HISTORY:  Oncology History  Lobular carcinoma in situ (LCIS) of left breast  11/18/2018 Initial Diagnosis   Biopsy on 11/18/18 showed LCIS with calcifications. Breast MRI on 01/14/19 showed a single area of non-mass enhancement in the later left breast.    02/14/2019 Surgery   Left lumpectomy Faith Mcmahon): LCIS, 0.7cm, with no evidence of invasive carcinoma.   03/20/2019 -  Anti-estrogen oral therapy   Tamoxifen 37m daily, planned duration of 5 years      RISK FACTORS:  Menarche was at age 79  First live birth at age 79  OCP use for approximately 7 years.  Ovaries intact: One.  Hysterectomy: yes.  Menopausal status: postmenopausal.  HRT use: 20+ years years. Colonoscopy: yes; less than 5 polyps, but is on a 5 year plan. Mammogram within the last year: yes. Number of breast biopsies: 2. Up to date with pelvic exams: yes. Any excessive radiation exposure in the past: no  Past Medical History:  Diagnosis Date  . [place] ([facility] 12/[place]   left breast cancer  . Cerebrovascular disease   . Complication of  anesthesia    with hysterectomy in 1980  . Family history of breast cancer   . Family history of ovarian cancer   . GERD (gastroesophageal reflux disease)   . Hypertension   . Hypothyroidism   . Obesity   . Palpitations   . PONV (postoperative nausea and vomiting)     Past Surgical History:  Procedure Laterality Date  . ADENOIDECTOMY    . BLADDER SURGERY    . BREAST LUMPECTOMY WITH RADIOACTIVE SEED LOCALIZATION Left 02/14/2019   Procedure: LEFT BREAST LUMPECTOMY WITH RADIOACTIVE SEED LOCALIZATION;  Surgeon: NAlphonsa Overall MD;  Location: [facility]  Service: General;  Laterality: Left;  . CHOLECYSTECTOMY    . ROTATOR CUFF REPAIR    . TONSILLECTOMY      Social History   Socioeconomic History  . Marital status: Widowed    Spouse name: Not on file  . Number of children: Not on file  . Years of education: Not on file  . Highest education level: Not on file  Occupational History  . Not on file  Tobacco Use  . Smoking status: Never Smoker  . Smokeless tobacco: Never Used  Substance and Sexual Activity  . Alcohol use: No  . Drug use: No  . Sexual activity: Not Currently    Birth control/protection: Post-menopausal  Other Topics Concern  . Not on file  Social History Narrative  . Not on file  Social Determinants of Health   Financial Resource Strain:   . Difficulty of Paying Living Expenses: Not on file  Food Insecurity:   . Worried About Charity fundraiser in the Last Year: Not on file  . Ran Out of Food in the Last Year: Not on file  Transportation Needs:   . Lack of Transportation (Medical): Not on file  . Lack of Transportation (Non-Medical): Not on file  Physical Activity:   . Days of Exercise per Week: Not on file  . Minutes of Exercise per Session: Not on file  Stress:   . Feeling of Stress : Not on file  Social Connections:   . Frequency of Communication with Friends and Family: Not on file  . Frequency of Social Gatherings with Friends  and Family: Not on file  . Attends Religious Services: Not on file  . Active Member of Clubs or Organizations: Not on file  . Attends Archivist Meetings: Not on file  . Marital Status: Not on file     FAMILY HISTORY:  We obtained a detailed, 4-generation family history.  Significant diagnoses are listed below: Family History  Problem Relation Age of Onset  . Heart failure Other   . Ovarian cancer Sister 19  . Melanoma Brother   . Breast cancer Maternal Aunt        dx over 44  . Prostate cancer Maternal Uncle   . Brain cancer Maternal Uncle     The patient has one daughter who is cancer free.  She has a brother and sister. Her brother had a skin cancer on the back of his neck that kept him out of the [employer].  Her sister is of special needs and had ovarian cancer around age 65.  The patient's mother had two brothers and a sister.  The sister had breast cancer in her 17's, one brother had prostate cancer and colon polyps and the second brother committed suicide after learning he had a brain tumor.  The maternal grandparents are deceased.  The patient's father died at 64.  There was no reported history of cancer on the paternal side.  [name] is unaware of previous family history of genetic testing for hereditary cancer risks. Patient's maternal ancestors are of [place] descent, and paternal ancestors are of [place] descent. There is no reported Ashkenazi Jewish ancestry. There is no known consanguinity.  GENETIC COUNSELING ASSESSMENT: Faith Mcmahon is a 79 y.o. female with a personal history of a high risk LCIS lesion and family history of ovarian, breast and prostate cancer which is somewhat suggestive of a hereditary breast cancer syndrome and predisposition to cancer given the combination of cancers in her family. We, therefore, discussed and recommended the following at today's visit.   DISCUSSION: We discussed that 5 - 10% of breast cancer and up to 20% of ovarian  cancer is hereditary, with most cases associated with BRCA mutations.  There are other genes that can be associated with hereditary breast or ovarian cancer syndromes.  We discussed that testing is beneficial for several reasons including knowing how to follow individuals after completing their treatment and understand if other family members could be at risk for cancer and allow them to undergo genetic testing.   We reviewed the characteristics, features and inheritance patterns of hereditary cancer syndromes. We also discussed genetic testing, including the appropriate family members to test, the process of testing, insurance coverage and turn-around-time for results. We discussed the implications of a negative, positive,  carrier and/or variant of uncertain significant result. We recommended Ms. Charette pursue genetic testing for the multi-cancer gene panel. The Multi-Gene Panel offered by [facility] includes sequencing and/or deletion duplication testing of the following 85 genes: AIP, ALK, APC, ATM, AXIN2,BAP1,  BARD1, BLM, BMPR1A, BRCA1, BRCA2, BRIP1, CASR, CDC73, CDH1, CDK4, CDKN1B, CDKN1C, CDKN2A (p14ARF), CDKN2A (p16INK4a), CEBPA, CHEK2, CTNNA1, DICER1, DIS3L2, EGFR (c.2369C>T, p.Thr790Met variant only), EPCAM (Deletion/duplication testing only), FH, FLCN, GATA2, GPC3, GREM1 (Promoter region deletion/duplication testing only), HOXB13 (c.251G>A, p.Gly84Glu), HRAS, KIT, MAX, MEN1, MET, MITF (c.952G>A, p.Glu318Lys variant only), MLH1, MSH2, MSH3, MSH6, MUTYH, NBN, NF1, NF2, NTHL1, PALB2, PDGFRA, PHOX2B, PMS2, POLD1, POLE, POT1, PRKAR1A, PTCH1, PTEN, RAD50, RAD51C, RAD51D, RB1, RECQL4, RET, RNF43, RUNX1, SDHAF2, SDHA (sequence changes only), SDHB, SDHC, SDHD, SMAD4, SMARCA4, SMARCB1, SMARCE1, STK11, SUFU, TERC, TERT, TMEM127, TP53, TSC1, TSC2, VHL, WRN and WT1.    Based on Ms. Moehring's personal history of a high risk lesion and family history of cancer, she meets NCCN, but not Medicare, criteria for  genetic testing. Despite that she meets NCCN criteria, she may still have an out of pocket cost. We discussed that if her out of pocket cost for testing is over $100, the laboratory will call and confirm whether she wants to proceed with testing.  If the out of pocket cost of testing is less than $100 she will be billed by the genetic testing laboratory.   PLAN: After considering the risks, benefits, and limitations, Ms. Collington provided informed consent to pursue genetic testing and the blood sample was sent to [facility] for analysis of the Multi-cancer panel. Results should be available within approximately 2-3 weeks' time, at which point they will be disclosed by telephone to Ms. Fazekas, as will any additional recommendations warranted by these results. Ms. Slatten will receive a summary of her genetic counseling visit and a copy of her results once available. This information will also be available in Epic.   Lastly, we encouraged Ms. Lamoreaux to remain in contact with cancer genetics annually so that we can continuously update the family history and inform her of any changes in cancer genetics and testing that may be of benefit for this family.   Ms. Furry's questions were answered to her satisfaction today. Our contact information was provided should additional questions or concerns arise. Thank you for the referral and allowing [place] to share in the care of your patient.   Sher Hellinger P. Florene Glen, [place], [facility] Licensed, Insurance risk surveyor [email or web address] phone: [phone]  The patient was seen for a total of 45 minutes in face-to-face genetic counseling.  This patient was discussed with Drs. Magrinat, Lindi Adie and/or Burr Medico who agrees with the above.    _______________________________________________________________________ For Office Staff:  Number of people involved in session: 1 Was an Intern/ student involved with case: no

## 2019-07-01 LAB — GENETIC SCREENING ORDER

## 2019-07-09 DIAGNOSIS — N3020 Other chronic cystitis without hematuria: Secondary | ICD-10-CM | POA: Diagnosis not present

## 2019-07-09 DIAGNOSIS — N3000 Acute cystitis without hematuria: Secondary | ICD-10-CM | POA: Diagnosis not present

## 2019-07-15 DIAGNOSIS — L820 Inflamed seborrheic keratosis: Secondary | ICD-10-CM | POA: Diagnosis not present

## 2019-07-15 DIAGNOSIS — D485 Neoplasm of uncertain behavior of skin: Secondary | ICD-10-CM | POA: Diagnosis not present

## 2019-07-22 ENCOUNTER — Telehealth: Payer: Self-pay | Admitting: Genetic Counselor

## 2019-07-22 NOTE — Telephone Encounter (Signed)
Revealed that there is no pathogenic variant due to hereditary breast cancer syndromes, but that we did find a pathogenic variant in APC which is associated with familial adenomatous polyposis (FAP).    Patient and her daughter will be seen tomorrow, Wed. Jul 23, 2019.

## 2019-07-23 ENCOUNTER — Inpatient Hospital Stay: Payer: Medicare Other

## 2019-07-23 ENCOUNTER — Other Ambulatory Visit: Payer: Self-pay | Admitting: Genetic Counselor

## 2019-07-23 ENCOUNTER — Other Ambulatory Visit: Payer: Self-pay

## 2019-07-23 ENCOUNTER — Inpatient Hospital Stay: Payer: Medicare Other | Attending: Hematology and Oncology | Admitting: Genetic Counselor

## 2019-07-23 ENCOUNTER — Encounter: Payer: Self-pay | Admitting: Genetic Counselor

## 2019-07-23 DIAGNOSIS — D126 Benign neoplasm of colon, unspecified: Secondary | ICD-10-CM

## 2019-07-23 DIAGNOSIS — Z1509 Genetic susceptibility to other malignant neoplasm: Secondary | ICD-10-CM

## 2019-07-23 DIAGNOSIS — Z1379 Encounter for other screening for genetic and chromosomal anomalies: Secondary | ICD-10-CM | POA: Diagnosis not present

## 2019-07-23 DIAGNOSIS — D1391 Familial adenomatous polyposis: Secondary | ICD-10-CM | POA: Insufficient documentation

## 2019-07-23 DIAGNOSIS — D0502 Lobular carcinoma in situ of left breast: Secondary | ICD-10-CM

## 2019-07-23 LAB — GENETIC SCREENING ORDER

## 2019-07-23 NOTE — Progress Notes (Signed)
GENETIC TEST RESULTS   Patient Name: Faith Mcmahon Patient Age: 79 y.o. Encounter Date: 07/23/2019  Referring Provider: Nicholas Lose, MD  Carol Ada, MD    Ms. Reffner was seen in the [facility] clinic on July 23, 2019 due to a personal and family history of colon polyps and a family history of cancer and concern regarding a hereditary predisposition to cancer in the family. Please refer to the prior Genetics clinic note for more information regarding Ms. Hecker's medical and family histories and our assessment at the time.   FAMILY HISTORY:  We obtained a detailed, 4-generation family history.  Significant diagnoses are listed below: Family History  Problem Relation Age of Onset  . Heart failure Other   . Ovarian cancer Sister 40  . Melanoma Brother   . Breast cancer Maternal Aunt        dx over 95  . Prostate cancer Maternal Uncle   . Brain cancer Maternal Uncle     The patient has one daughter who is cancer free.  She has a brother and sister. Her brother had a skin cancer on the back of his neck that kept him out of the [employer].  Her sister is of special needs and had ovarian cancer around age 58.  The patient's mother had two brothers and a sister.  The sister had breast cancer in her 30's, one brother had prostate cancer and colon polyps and the second brother committed suicide after learning he had a brain tumor.  The maternal grandparents are deceased.  The patient's father died at 66.  There was no reported history of cancer on the paternal side.  Ms. Renk is unaware of previous family history of genetic testing for hereditary cancer risks. Patient's maternal ancestors are of [place] descent, and paternal ancestors are of [place] descent. There is no reported Ashkenazi Jewish ancestry. There is no known consanguinity.   GENETIC TESTING:  At the time of Ms. Woehler's visit, we recommended she pursue genetic testing of the Multi-cancer panel.  The genetic testing was reported out on July 15, 2019 through the Multi-Cancer Panel offered by [facility] which identified a single, heterozygous pathogenic gene mutation called APC, c.6760A>T. There were no deleterious mutations in AIP, ALK, ATM, AXIN2,BAP1,  BARD1, BLM, BMPR1A, BRCA1, BRCA2, BRIP1, CASR, CDC73, CDH1, CDK4, CDKN1B, CDKN1C, CDKN2A (p14ARF), CDKN2A (p16INK4a), CEBPA, CHEK2, CTNNA1, DICER1, DIS3L2, EGFR (c.2369C>T, p.Thr790Met variant only), EPCAM (Deletion/duplication testing only), FH, FLCN, GATA2, GPC3, GREM1 (Promoter region deletion/duplication testing only), HOXB13 (c.251G>A, p.Gly84Glu), HRAS, KIT, MAX, MEN1, MET, MITF (c.952G>A, p.Glu318Lys variant only), MLH1, MSH2, MSH3, MSH6, MUTYH, NBN, NF1, NF2, NTHL1, PALB2, PDGFRA, PHOX2B, PMS2, POLD1, POLE, POT1, PRKAR1A, PTCH1, PTEN, RAD50, RAD51C, RAD51D, RB1, RECQL4, RET, RNF43, RUNX1, SDHAF2, SDHA (sequence changes only), SDHB, SDHC, SDHD, SMAD4, SMARCA4, SMARCB1, SMARCE1, STK11, SUFU, TERC, TERT, TMEM127, TP53, TSC1, TSC2, VHL, WRN and WT1.  .    Clinical condition Familial adenomatous polyposis (FAP) is a genetic condition that is characterized by the presence of greater than 100 adenomatous colon polyps. On average, individuals with classic FAP develop polyps at 77 years of age, with 95% of individuals developing polyps by age 38. The risk of developing colon cancer is from 43% to theoretically 100%, if colectomy is not performed (PMID: 23557322, 02542706, 2376283). The average age of colon cancer diagnosis is 70 years, with 93% of untreated individuals diagnosed by age 1 (PMID: 15176160).  FAP may increase the risk of developing other types of non-colonic cancers: . 4-12% risk  of cancer of the duodenum (PMID: 40973532, 99242683) . 1-12% risk of papillary thyroid cancer (PMID: 41962229) . 1% risk of stomach cancer (PMID: 79892119) . <1% risk of brain/central nervous system malignancies (PMID: 41740814) . 1-2% risk of hepatoblastoma  in children (PMID: 4818563, 1497026) . increased risk of pancreatic cancer (PMID: 37858850, 27741287, 8676720)  Extra-colonic findings can include extra or missing teeth, desmoid tumors, osteomas, epidermoid cysts, fibromas, and congenital hyperplasia of the retinal pigment epithelium (CHRPE), which is a specific finding that requires a specialized eye examination to identify (PMID: 94709628).  In addition to classic FAP, three subtypes of FAP have been described: Gardner syndrome, Turcot syndrome, and attenuated FAP (AFAP). Gardner syndrome has the same disease progression and risk of colon cancer as classic FAP but is also associated with the development of other extracolonic findings including desmoid tumors, sebaceous cysts, osteomas, supernumerary teeth, and cancer of the duodenum, exocrine pancreas, thyroid (papillary adenocarcinoma), hepatoblastomas, and central nervous system (medulloblastomas). Gardner syndrome was once thought to be a distinct clinical entity; however, like FAP, it is now known to arise from pathogenic variants in APC. Furthermore, with sufficient investigation, subtle extraintestinal manifestations can be found in almost all individuals with FAP (PMID: 36629476).  Similar to Gardner syndrome, Turcot syndrome was once thought to be a distinct clinical entity characterized by the presence of numerous colon adenomas and medulloblastoma. However, it is now assumed that all individuals with an APC pathogenic variant are at increased risk for brain tumors, although the absolute risk is only approximately 1% (PMID: 54650354).   AFAP has a later age of onset than classic FAP (approximately 77 years of age), presents with fewer adenomatous polyps (<100) that are primarily proximal (right-sided), and has an overall lower lifetime risk of developing cancer of approximately 70% (PMID: 65681275, 17001749, 4496759, 16384665).  Based on Ms. Faulk's personal and family history of colon  polyps and lack of family history of colon cancer, we suspect that the presentation of this finding is attenuated FAP.  Inheritance FAP has autosomal dominant inheritance. This means that an individual with a pathogenic variant has a 50% chance of passing the condition on to their offspring. It is now possible to identify at-risk relatives who can pursue testing for this specific familial variant. While many cases are inherited from a parent, some occur spontaneously (PMID: 5087585561). This means that an individual with a pathogenic variant has parents who do not have it. However, that individual now has a 50% risk of passing it on to future offspring.  Management Medical management and surveillance protocols have been developed by the [place] (NCCN) for individuals with FAP and AFAP ([place], Clinical practice guidelines in oncology. Genetic/Familial High Risk Assessment: Colorectal). These recommendations apply to individuals diagnosed with FAP and AFAP based on clinical findings or genetic test results, as well as individuals at an increased risk based on family history who have not had negative genetic testing:  FAP  . Colon: o Annual sigmoidoscopy/colonoscopy beginning at 26-53 years of age. o A colectomy or proctocolectomy is recommended after numerous polyps are detected, due to their high malignant potential.  Total proctocolectomy with ileal pouch-anal anastomosis (IPAA) is generally the recommended surgical approach for individuals with FAP. o If colectomy with ileorectal anastomosis (IRA) is performed, endoscopic evaluation of the remaining rectum is recommended every 6-12 months depending on polyp burden. o If total proctocolectomy with IPAA is performed, endoscopic evaluation of the ileal pouch or ileostomy is recommended every 1-3  years depending on polyp burden.  If large, flat polyps with villous histology or polyps with high-grade  dysplasia are identified, then surveillance frequency should be every 6 months. o Chemoprevention (e.g., sulindac) can aid in management of the remaining rectum; however, there are no medications currently approved by the FDA for this indication.  There are data to suggest that sulindac showed the most significant polyp regression, but it is unclear if the decrease in polyp burden equates to reduction in colorectal cancer risk. Marland Kitchen Extracolonic: o Upper endoscopy with side-viewing examination beginning at age 50-25 years. Consider upper endoscopy at an earlier age if colectomy is performed prior to age 58 years. Frequency of upper endoscopy surveillance is dependent on polyp burden. - It is important to note that fundic gland polyps are common in individuals with FAP and while focal low-grade dysplasia can be identified, it is typically non-progressive. - Non-fundic gland polyps should be managed endoscopically if possible. o Annual thyroid examination beginning in the late teenage years.  Annual thyroid ultrasound may be considered, but data are lacking to support this recommendation. o Annual physical examination for CNS cancers. o Annual abdominal palpation for desmoids. - If family history of symptomatic desmoids: consider abdominal MRI or CT  1-3 years post-colectomy, then every 5-10 years. o For small bowel polyps and cancer, consider adding small bowel visualization to MRI or CT for desmoids especially if duodenal polyposis is advanced. o Consider liver palpation, abdominal ultrasound, and measurement of AFP every 3-6 months during the first 5 years of life. AFAP . Colon: o Colonoscopy beginning in the late teens, then every 2-3 years. - If a small polyp burden is found, repeat colonoscopy with polypectomy every 1-2 years. - If polyp burden is too great, consider colectomy with ileorectal anastomosis (IRA) or proctocolectomy with ileal pouch-anal anastomosis (IPAA) if rectal polyposis is not  manageable with polypectomy. o Following colectomy with IRA, endoscopic evaluation of the remaining rectum is recommended every 6-12 months depending on polyp burden.  Chemoprevention (e.g., sulindac) can aid in management of the remaining rectum; however, there are no medications currently approved by the FDA for this indication.  There are data to suggest that sulindac showed the most significant polyp regression, but it is unclear if the decrease in polyp burden equates to reduction in colorectal cancer risk. Marland Kitchen Extracolonic: o Annual physical examination. o Annual thyroid examination. o Upper endoscopy with side-viewing examination beginning at age 38-25 years. Consider upper endoscopy at an earlier age if colectomy is performed prior to age 81 years. Frequency of upper endoscopy surveillance is dependent on polyp burden.  An individual's cancer risk and medical management are not determined by genetic test results alone. Overall cancer risk assessment incorporates additional factors, including personal medical history, family history, and any available genetic information that may result in a personalized plan for cancer prevention and surveillance.  It is advantageous to know if an individual has a pathogenic variant in APC as medical management recommendations can be implemented. At-risk relatives can be identified, allowing pursuit of a diagnostic evaluation. In addition, the available information regarding hereditary cancer susceptibility genes is constantly evolving and more clinically relevant APC data are likely to become available in the near future. Awareness of this cancer predisposition encourages patients and their providers to inform at-risk family members, to diligently follow standard screening protocols, and to be vigilant in maintaining close and regular contact with their local genetics clinic in anticipation of new information.   Ms. Korzeniewski's APC variant appears  to be a novel  genetic change.  Some attenuated APC variants may not increase the number of colon polyps dramatically, but instead increase the number of gastric polyps.  To help clarify the actual risk for symptoms for this particular APC variant we recommend cascade testing of relatives.  Any relative that tests positive for this variant should undergo colonoscopy and endoscopy to look for polyps.  We are also testing Ms. Bayle through [facility] in order to use RNA and see if that would help clarify this variant.  Approximately 1 in 73 individuals will have variants impacted, whether they are classified as benign or pathogenic or identified deep within intronic variants.Marland Kitchen  FAMILY MEMBERS: It is important that all of Ms. Giannini's relatives (both men and women) know of the presence of this gene mutation.  Women need to know that they may be at increased risk for breast and colon cancers.  Men are at slightly increased risk for breast, prostate and colon cancers.  Genetic testing can sort out who in your family is at risk and who is not.  We would be happy to help meet with and coordinate genetic testing for any relative that is interested.  Ms. Eduardo's daughter and siblings are at 50% risk to have inherited the mutation found in her. We recommend they have genetic testing for this same mutation, as identifying the presence of this mutation would allow them to also take advantage of risk-reducing measures. Ms. Luhmann's daughter has already undergone genetic testing through her OB/GYN office due to a family history of breast cancer on her father's side of the family.  She is awaiting her results, and will contact us with those results once they are back.  PLAN:   We have several recommendations for Ms. Cupps.    We will send blood out to [facility] today.  Testing will be performed out of pocket for $250.    We recommend that Ms. Northup have an upper endoscopy looking for gastric polyps.  Most  individuals with pathogenic APC variants will have gastric polyposis.  She reports that she is due for a colonoscopy in June, but that it has not been scheduled.  We will leave it up to her GI specialist to determine if she should wait until June or if she should get this sooner.  We recommend cascade testing for Ms. Thorns's relatives.  This includes her daughter and brother (her sister has special needs and would not be able to tolerate a colonoscopy) and possibly nephews.  If they test positive they should undergo colonoscopy and upper endoscopy.  SUPPORT AND RESOURCES:  We provided several fact sheets and a family support brochure from [employer] called 'A Patient Guide to FAP'.      Our contact number was provided. Ms. Funchess's questions were answered to her satisfaction, and she knows she is welcome to call us at anytime with additional questions or concerns.   Roma Kayser, [place], [facility] Licensed, Certified Genetic Counselor [email or web address] phone: [phone]  The patient was seen for a total of 35 minutes in face-to-face genetic counseling.

## 2019-07-24 DIAGNOSIS — R300 Dysuria: Secondary | ICD-10-CM | POA: Diagnosis not present

## 2019-08-06 DIAGNOSIS — Z86010 Personal history of colonic polyps: Secondary | ICD-10-CM | POA: Diagnosis not present

## 2019-08-06 DIAGNOSIS — I10 Essential (primary) hypertension: Secondary | ICD-10-CM | POA: Diagnosis not present

## 2019-08-06 DIAGNOSIS — K219 Gastro-esophageal reflux disease without esophagitis: Secondary | ICD-10-CM | POA: Diagnosis not present

## 2019-08-13 DIAGNOSIS — N3020 Other chronic cystitis without hematuria: Secondary | ICD-10-CM | POA: Diagnosis not present

## 2019-08-26 ENCOUNTER — Encounter: Payer: Self-pay | Admitting: Hematology and Oncology

## 2019-08-26 DIAGNOSIS — Z86010 Personal history of colonic polyps: Secondary | ICD-10-CM | POA: Diagnosis not present

## 2019-08-26 DIAGNOSIS — D120 Benign neoplasm of cecum: Secondary | ICD-10-CM | POA: Diagnosis not present

## 2019-08-26 DIAGNOSIS — D122 Benign neoplasm of ascending colon: Secondary | ICD-10-CM | POA: Diagnosis not present

## 2019-08-26 DIAGNOSIS — K5730 Diverticulosis of large intestine without perforation or abscess without bleeding: Secondary | ICD-10-CM | POA: Diagnosis not present

## 2019-08-26 DIAGNOSIS — K317 Polyp of stomach and duodenum: Secondary | ICD-10-CM | POA: Diagnosis not present

## 2019-08-26 DIAGNOSIS — K635 Polyp of colon: Secondary | ICD-10-CM | POA: Diagnosis not present

## 2019-08-28 DIAGNOSIS — R8279 Other abnormal findings on microbiological examination of urine: Secondary | ICD-10-CM | POA: Diagnosis not present

## 2019-08-28 DIAGNOSIS — N3020 Other chronic cystitis without hematuria: Secondary | ICD-10-CM | POA: Diagnosis not present

## 2019-09-11 DIAGNOSIS — N3020 Other chronic cystitis without hematuria: Secondary | ICD-10-CM | POA: Diagnosis not present

## 2019-10-13 DIAGNOSIS — E039 Hypothyroidism, unspecified: Secondary | ICD-10-CM | POA: Diagnosis not present

## 2019-10-13 DIAGNOSIS — E7800 Pure hypercholesterolemia, unspecified: Secondary | ICD-10-CM | POA: Diagnosis not present

## 2019-10-13 DIAGNOSIS — I10 Essential (primary) hypertension: Secondary | ICD-10-CM | POA: Diagnosis not present

## 2019-10-16 DIAGNOSIS — R921 Mammographic calcification found on diagnostic imaging of breast: Secondary | ICD-10-CM | POA: Diagnosis not present

## 2019-10-27 DIAGNOSIS — Z85828 Personal history of other malignant neoplasm of skin: Secondary | ICD-10-CM | POA: Diagnosis not present

## 2019-10-27 DIAGNOSIS — D225 Melanocytic nevi of trunk: Secondary | ICD-10-CM | POA: Diagnosis not present

## 2019-10-27 DIAGNOSIS — L720 Epidermal cyst: Secondary | ICD-10-CM | POA: Diagnosis not present

## 2019-10-27 DIAGNOSIS — L821 Other seborrheic keratosis: Secondary | ICD-10-CM | POA: Diagnosis not present

## 2019-10-27 DIAGNOSIS — D1801 Hemangioma of skin and subcutaneous tissue: Secondary | ICD-10-CM | POA: Diagnosis not present

## 2019-11-03 DIAGNOSIS — I6529 Occlusion and stenosis of unspecified carotid artery: Secondary | ICD-10-CM | POA: Diagnosis not present

## 2019-11-03 DIAGNOSIS — M810 Age-related osteoporosis without current pathological fracture: Secondary | ICD-10-CM | POA: Diagnosis not present

## 2019-11-03 DIAGNOSIS — K219 Gastro-esophageal reflux disease without esophagitis: Secondary | ICD-10-CM | POA: Diagnosis not present

## 2019-11-03 DIAGNOSIS — Z0000 Encounter for general adult medical examination without abnormal findings: Secondary | ICD-10-CM | POA: Diagnosis not present

## 2019-11-03 DIAGNOSIS — E039 Hypothyroidism, unspecified: Secondary | ICD-10-CM | POA: Diagnosis not present

## 2019-11-04 ENCOUNTER — Other Ambulatory Visit: Payer: Self-pay | Admitting: Internal Medicine

## 2019-11-04 DIAGNOSIS — E041 Nontoxic single thyroid nodule: Secondary | ICD-10-CM

## 2019-11-17 ENCOUNTER — Ambulatory Visit
Admission: RE | Admit: 2019-11-17 | Discharge: 2019-11-17 | Disposition: A | Discharge status: Home / Self Care | Payer: Medicare Other | Source: Ambulatory Visit | Attending: Internal Medicine | Admitting: Internal Medicine

## 2019-11-17 ENCOUNTER — Other Ambulatory Visit: Payer: Self-pay

## 2019-11-17 DIAGNOSIS — E041 Nontoxic single thyroid nodule: Secondary | ICD-10-CM | POA: Diagnosis not present

## 2019-11-18 DIAGNOSIS — I10 Essential (primary) hypertension: Secondary | ICD-10-CM | POA: Diagnosis not present

## 2019-12-08 DIAGNOSIS — Z780 Asymptomatic menopausal state: Secondary | ICD-10-CM | POA: Diagnosis not present

## 2020-03-15 DIAGNOSIS — N3020 Other chronic cystitis without hematuria: Secondary | ICD-10-CM | POA: Diagnosis not present

## 2020-03-26 DIAGNOSIS — Z23 Encounter for immunization: Secondary | ICD-10-CM | POA: Diagnosis not present

## 2020-04-21 ENCOUNTER — Other Ambulatory Visit: Payer: Self-pay | Admitting: *Deleted

## 2020-04-21 DIAGNOSIS — D0502 Lobular carcinoma in situ of left breast: Secondary | ICD-10-CM

## 2020-05-03 DIAGNOSIS — H04123 Dry eye syndrome of bilateral lacrimal glands: Secondary | ICD-10-CM | POA: Diagnosis not present

## 2020-05-03 DIAGNOSIS — Z961 Presence of intraocular lens: Secondary | ICD-10-CM | POA: Diagnosis not present

## 2020-05-16 ENCOUNTER — Other Ambulatory Visit: Payer: Medicare Other

## 2020-05-19 ENCOUNTER — Other Ambulatory Visit: Payer: Self-pay

## 2020-05-19 ENCOUNTER — Ambulatory Visit
Admission: RE | Admit: 2020-05-19 | Discharge: 2020-05-19 | Disposition: A | Discharge status: Home / Self Care | Payer: Medicare Other | Source: Ambulatory Visit | Attending: Hematology and Oncology | Admitting: Hematology and Oncology

## 2020-05-19 DIAGNOSIS — Z853 Personal history of malignant neoplasm of breast: Secondary | ICD-10-CM | POA: Diagnosis not present

## 2020-05-19 DIAGNOSIS — D0502 Lobular carcinoma in situ of left breast: Secondary | ICD-10-CM

## 2020-05-19 MED ORDER — GADOBUTROL 1 MMOL/ML IV SOLN
7.0000 mL | Freq: Once | INTRAVENOUS | Status: AC | PRN
  Administered 2020-05-19: 7 mL via INTRAVENOUS

## 2020-05-31 DIAGNOSIS — L570 Actinic keratosis: Secondary | ICD-10-CM | POA: Diagnosis not present

## 2020-05-31 DIAGNOSIS — L821 Other seborrheic keratosis: Secondary | ICD-10-CM | POA: Diagnosis not present

## 2020-05-31 DIAGNOSIS — Z85828 Personal history of other malignant neoplasm of skin: Secondary | ICD-10-CM | POA: Diagnosis not present

## 2020-06-22 NOTE — Progress Notes (Signed)
Patient Care Team: Faith Pretty, MD as PCP - General (Internal Medicine) Faith Gibbs, MD as Consulting Physician (Radiology)  DIAGNOSIS:    ICD-10-CM   1. Lobular carcinoma in situ (LCIS) of left breast  D05.02     SUMMARY OF ONCOLOGIC HISTORY: Oncology History  Lobular carcinoma in situ (LCIS) of left breast  11/18/2018 Initial Diagnosis   Biopsy on 11/18/18 showed LCIS with calcifications. Breast MRI on 01/14/19 showed a single area of non-mass enhancement in the later left breast.    02/14/2019 Surgery   Left lumpectomy Faith Mcmahon): LCIS, 0.7cm, with no evidence of invasive carcinoma.   03/20/2019 -  Anti-estrogen oral therapy   Tamoxifen 10mg  daily, planned duration of 5 years     CHIEF COMPLIANT: Follow-up of left breast LCIS on tamoxifen  INTERVAL HISTORY: Faith Mcmahon is a 80 y.o. with above-mentioned history of LCIS of the left breast who underwent a lumpectomy and is currently on 10mg  tamoxifen. Breast MRI on 05/19/20 showed a 0.8cm area of non-mass enhancement in the lateral left breast near the lumpectomy site. She presents to the clinic today for follow-up.   ALLERGIES:  is allergic to penicillins, ciprofloxacin, nsaids, prolia [denosumab], and tetanus toxoids.  MEDICATIONS:  Current Outpatient Medications  Medication Sig Dispense Refill   aspirin 81 MG tablet Take 81 mg by mouth daily.     bisoprolol-hydrochlorothiazide (ZIAC) 5-6.25 MG tablet Take 1 tablet by mouth daily. 90 tablet 2   calcium carbonate 200 MG capsule Take 250 mg by mouth 2 (two) times daily with a meal.     cholecalciferol (VITAMIN D) 1000 UNITS tablet Take 1,000 Units by mouth daily.     CRANBERRY FRUIT PO Take 1 tablet by mouth 2 (two) times daily.     levothyroxine (SYNTHROID, LEVOTHROID) 50 MCG tablet SAT- SUN     levothyroxine (SYNTHROID, LEVOTHROID) 75 MCG tablet MON- FRI     losartan (COZAAR) 50 MG tablet Take 1 tablet (50 mg total) by mouth daily. 90 tablet 3    Methylcellulose, Laxative, (CITRUCEL PO) Take 2 tablets by mouth daily.     Multiple Vitamin (MULTIVITAMIN) capsule Take 1 capsule by mouth daily.     omeprazole (PRILOSEC) 20 MG capsule Take 20 mg by mouth daily.     Probiotic Product (PROBIOTIC ADVANCED PO) Take 1 capsule by mouth 2 (two) times daily.     rosuvastatin (CRESTOR) 10 MG tablet Take 10 mg by mouth every other day.      tamoxifen (NOLVADEX) 10 MG tablet Take 1 tablet (10 mg total) by mouth daily. 90 tablet 3   vitamin B-12 (CYANOCOBALAMIN) 1000 MCG tablet Take 1,000 mcg by mouth daily.     No current facility-administered medications for this visit.    PHYSICAL EXAMINATION: ECOG PERFORMANCE STATUS: 1 - Symptomatic but completely ambulatory  Vitals:   06/23/20 0840  BP: (!) 158/70  Pulse: (!) 59  Resp: 18  Temp: 97.7 F (36.5 C)  SpO2: 100%   Filed Weights   06/23/20 0840  Weight: 155 lb 11.2 oz (70.6 kg)    BREAST: No palpable masses or nodules in either right or left breasts. No palpable axillary supraclavicular or infraclavicular adenopathy no breast tenderness or nipple discharge. (exam performed in the presence of a chaperone)  LABORATORY DATA:  I have reviewed the data as listed CMP Latest Ref Rng & Units 05/01/2019 04/23/2019 02/11/2019  Glucose 65 - 99 mg/dL 142(H) 107(H) 104(H)  BUN 8 - 27 mg/dL 18 17 13  Creatinine 0.57 - 1.00 mg/dL 0.86 0.78 0.74  Sodium 134 - 144 mmol/L 139 141 139  Potassium 3.5 - 5.2 mmol/L 4.8 4.7 4.0  Chloride 96 - 106 mmol/L 100 102 102  CO2 20 - 29 mmol/L 26 24 28   Calcium 8.7 - 10.3 mg/dL 9.8 9.8 9.6    No results found for: WBC, HGB, HCT, MCV, PLT, NEUTROABS  ASSESSMENT & PLAN:  Lobular carcinoma in situ (LCIS) of left breast Left lumpectomy with Dr. Lucia Mcmahon on 02/14/19 for which pathology showed LCIS, 0.7cm, with no evidence of invasive carcinoma. cumulative risk lifetime would be around23.4% based upon Tyer Cusick model  Risk reduction: Tamoxifen 10 mg daily  started 03/20/2019 discontinued November 2020 because of severe vaginal yeast infection that did not get better in spite of multiple treatments.  It finally got better after she discontinued tamoxifen.  Breast cancer surveillance: Mammogram 10/16/2019: Benign Density C Breast MRI 05/20/2020: stable 8 mm linear NME Breast Exam 06/24/19: Benign  Her daughter passed away in Mar 07, 2020 due to COVID-19. Return to clinic in 1 year for follow-up    No orders of the defined types were placed in this encounter.  The patient has a good understanding of the overall plan. she agrees with it. she will call with any problems that may develop before the next visit here.  Total time spent: 20 mins including face to face time and time spent for planning, charting and coordination of care  Nicholas Lose, MD 06/23/2020  I, Cloyde Reams Dorshimer, am acting as scribe for Dr. Nicholas Lose.  I have reviewed the above documentation for accuracy and completeness, and I agree with the above.

## 2020-06-23 ENCOUNTER — Inpatient Hospital Stay: Payer: Medicare Other | Attending: Hematology and Oncology | Admitting: Hematology and Oncology

## 2020-06-23 ENCOUNTER — Other Ambulatory Visit: Payer: Self-pay

## 2020-06-23 DIAGNOSIS — Z170 Estrogen receptor positive status [ER+]: Secondary | ICD-10-CM | POA: Insufficient documentation

## 2020-06-23 DIAGNOSIS — D0502 Lobular carcinoma in situ of left breast: Secondary | ICD-10-CM

## 2020-06-23 DIAGNOSIS — Z79899 Other long term (current) drug therapy: Secondary | ICD-10-CM | POA: Insufficient documentation

## 2020-06-23 DIAGNOSIS — Z79810 Long term (current) use of selective estrogen receptor modulators (SERMs): Secondary | ICD-10-CM | POA: Insufficient documentation

## 2020-06-23 DIAGNOSIS — Z7982 Long term (current) use of aspirin: Secondary | ICD-10-CM | POA: Diagnosis not present

## 2020-06-23 NOTE — Assessment & Plan Note (Signed)
Left lumpectomy with Dr. [name] on 02/14/19 for which pathology showed LCIS, 0.7cm, with no evidence of invasive carcinoma. cumulative risk lifetime would be around23.4% based upon Tyer Cusick model  Risk reduction: Tamoxifen 10 mg daily started 03/20/2019 discontinued November 2020 because of severe vaginal yeast infection that did not get better in spite of multiple treatments.  It finally got better after she discontinued tamoxifen.  Breast cancer surveillance: Mammogram 10/16/2019: Benign Density C Breast MRI 05/20/2020: stable 8 mm linear NME Breast Exam 06/24/19: Benign  Return to clinic in 1 year for follow-up

## 2020-07-13 DIAGNOSIS — H60543 Acute eczematoid otitis externa, bilateral: Secondary | ICD-10-CM | POA: Diagnosis not present

## 2020-10-11 DIAGNOSIS — K219 Gastro-esophageal reflux disease without esophagitis: Secondary | ICD-10-CM | POA: Diagnosis not present

## 2020-10-11 DIAGNOSIS — K317 Polyp of stomach and duodenum: Secondary | ICD-10-CM | POA: Diagnosis not present

## 2020-10-11 DIAGNOSIS — Z86010 Personal history of colonic polyps: Secondary | ICD-10-CM | POA: Diagnosis not present

## 2020-10-18 DIAGNOSIS — Z1231 Encounter for screening mammogram for malignant neoplasm of breast: Secondary | ICD-10-CM | POA: Diagnosis not present

## 2020-10-28 DIAGNOSIS — R928 Other abnormal and inconclusive findings on diagnostic imaging of breast: Secondary | ICD-10-CM | POA: Diagnosis not present

## 2020-10-28 DIAGNOSIS — R921 Mammographic calcification found on diagnostic imaging of breast: Secondary | ICD-10-CM | POA: Diagnosis not present

## 2020-10-29 DIAGNOSIS — E039 Hypothyroidism, unspecified: Secondary | ICD-10-CM | POA: Diagnosis not present

## 2020-10-29 DIAGNOSIS — I10 Essential (primary) hypertension: Secondary | ICD-10-CM | POA: Diagnosis not present

## 2020-11-04 DIAGNOSIS — E039 Hypothyroidism, unspecified: Secondary | ICD-10-CM | POA: Diagnosis not present

## 2020-11-04 DIAGNOSIS — M810 Age-related osteoporosis without current pathological fracture: Secondary | ICD-10-CM | POA: Diagnosis not present

## 2020-11-04 DIAGNOSIS — Z0000 Encounter for general adult medical examination without abnormal findings: Secondary | ICD-10-CM | POA: Diagnosis not present

## 2020-11-04 DIAGNOSIS — I6529 Occlusion and stenosis of unspecified carotid artery: Secondary | ICD-10-CM | POA: Diagnosis not present

## 2020-11-04 DIAGNOSIS — K219 Gastro-esophageal reflux disease without esophagitis: Secondary | ICD-10-CM | POA: Diagnosis not present

## 2020-11-04 DIAGNOSIS — I10 Essential (primary) hypertension: Secondary | ICD-10-CM | POA: Diagnosis not present

## 2020-11-04 DIAGNOSIS — I119 Hypertensive heart disease without heart failure: Secondary | ICD-10-CM | POA: Diagnosis not present

## 2020-11-04 DIAGNOSIS — G250 Essential tremor: Secondary | ICD-10-CM | POA: Diagnosis not present

## 2020-11-04 DIAGNOSIS — G4700 Insomnia, unspecified: Secondary | ICD-10-CM | POA: Diagnosis not present

## 2020-11-18 ENCOUNTER — Encounter: Payer: Self-pay | Admitting: Hematology and Oncology

## 2020-11-22 DIAGNOSIS — Z85828 Personal history of other malignant neoplasm of skin: Secondary | ICD-10-CM | POA: Diagnosis not present

## 2020-11-22 DIAGNOSIS — D485 Neoplasm of uncertain behavior of skin: Secondary | ICD-10-CM | POA: Diagnosis not present

## 2020-11-22 DIAGNOSIS — D1801 Hemangioma of skin and subcutaneous tissue: Secondary | ICD-10-CM | POA: Diagnosis not present

## 2020-11-22 DIAGNOSIS — L820 Inflamed seborrheic keratosis: Secondary | ICD-10-CM | POA: Diagnosis not present

## 2020-11-22 DIAGNOSIS — D225 Melanocytic nevi of trunk: Secondary | ICD-10-CM | POA: Diagnosis not present

## 2020-11-22 DIAGNOSIS — L814 Other melanin hyperpigmentation: Secondary | ICD-10-CM | POA: Diagnosis not present

## 2020-11-22 DIAGNOSIS — L821 Other seborrheic keratosis: Secondary | ICD-10-CM | POA: Diagnosis not present

## 2021-02-07 DIAGNOSIS — M810 Age-related osteoporosis without current pathological fracture: Secondary | ICD-10-CM | POA: Diagnosis not present

## 2021-02-16 DIAGNOSIS — L509 Urticaria, unspecified: Secondary | ICD-10-CM | POA: Diagnosis not present

## 2021-02-24 DIAGNOSIS — L509 Urticaria, unspecified: Secondary | ICD-10-CM | POA: Diagnosis not present

## 2021-03-17 DIAGNOSIS — R8279 Other abnormal findings on microbiological examination of urine: Secondary | ICD-10-CM | POA: Diagnosis not present

## 2021-03-17 DIAGNOSIS — N3020 Other chronic cystitis without hematuria: Secondary | ICD-10-CM | POA: Diagnosis not present

## 2021-03-25 DIAGNOSIS — Z23 Encounter for immunization: Secondary | ICD-10-CM | POA: Diagnosis not present

## 2021-04-11 DIAGNOSIS — I119 Hypertensive heart disease without heart failure: Secondary | ICD-10-CM | POA: Diagnosis not present

## 2021-04-11 DIAGNOSIS — E039 Hypothyroidism, unspecified: Secondary | ICD-10-CM | POA: Diagnosis not present

## 2021-04-11 DIAGNOSIS — E7800 Pure hypercholesterolemia, unspecified: Secondary | ICD-10-CM | POA: Diagnosis not present

## 2021-04-11 DIAGNOSIS — I10 Essential (primary) hypertension: Secondary | ICD-10-CM | POA: Diagnosis not present

## 2021-05-20 ENCOUNTER — Ambulatory Visit
Admission: RE | Admit: 2021-05-20 | Discharge: 2021-05-20 | Disposition: A | Discharge status: Home / Self Care | Payer: Medicare Other | Source: Ambulatory Visit | Attending: Hematology and Oncology | Admitting: Hematology and Oncology

## 2021-05-20 ENCOUNTER — Other Ambulatory Visit: Payer: Self-pay

## 2021-05-20 DIAGNOSIS — Z853 Personal history of malignant neoplasm of breast: Secondary | ICD-10-CM | POA: Diagnosis not present

## 2021-05-20 DIAGNOSIS — Z803 Family history of malignant neoplasm of breast: Secondary | ICD-10-CM | POA: Diagnosis not present

## 2021-05-20 DIAGNOSIS — Z9889 Other specified postprocedural states: Secondary | ICD-10-CM | POA: Diagnosis not present

## 2021-05-20 DIAGNOSIS — D0502 Lobular carcinoma in situ of left breast: Secondary | ICD-10-CM

## 2021-05-20 MED ORDER — GADOBUTROL 1 MMOL/ML IV SOLN
7.0000 mL | Freq: Once | INTRAVENOUS | Status: AC | PRN
  Administered 2021-05-20: 7 mL via INTRAVENOUS

## 2021-06-22 NOTE — Assessment & Plan Note (Signed)
Left lumpectomy with Dr. [name] on 02/14/19 for which pathology showed LCIS, 0.7cm, with no evidence of invasive carcinoma. cumulative risk lifetime would be around23.4% based upon Tyer Cusick model  Risk reduction: Tamoxifen10 mg daily started 10/8/2020discontinued November 2020 because of severe vaginal yeast infection that did not get better in spite of multiple treatments. It finally got better after she discontinued tamoxifen.  Breast cancer surveillance: Mammogram 10/28/2020: at [facility], Benign Density C Breast MRI 05/20/2021: stable 8 mm linear NME Breast Exam 06/23/21: Benign  Her daughter passed away in 03/17/20 due to COVID-19. Return to clinic in 1 year for follow-up

## 2021-06-23 ENCOUNTER — Inpatient Hospital Stay: Payer: Medicare Other | Attending: Hematology and Oncology | Admitting: Hematology and Oncology

## 2021-06-23 ENCOUNTER — Other Ambulatory Visit: Payer: Self-pay

## 2021-06-23 DIAGNOSIS — D0502 Lobular carcinoma in situ of left breast: Secondary | ICD-10-CM | POA: Diagnosis not present

## 2021-06-23 DIAGNOSIS — Z86000 Personal history of in-situ neoplasm of breast: Secondary | ICD-10-CM | POA: Diagnosis not present

## 2021-06-23 DIAGNOSIS — Z7982 Long term (current) use of aspirin: Secondary | ICD-10-CM | POA: Insufficient documentation

## 2021-06-23 DIAGNOSIS — Z79899 Other long term (current) drug therapy: Secondary | ICD-10-CM | POA: Diagnosis not present

## 2021-06-23 NOTE — Progress Notes (Signed)
Patient Care Team: Deland Pretty, MD as PCP - General (Internal Medicine) Lorelle Gibbs, MD as Consulting Physician (Radiology)  DIAGNOSIS:  Encounter Diagnosis  Name Primary?   Lobular carcinoma in situ (LCIS) of left breast     SUMMARY OF ONCOLOGIC HISTORY: Oncology History  Lobular carcinoma in situ (LCIS) of left breast  11/18/2018 Initial Diagnosis   Biopsy on 11/18/18 showed LCIS with calcifications. Breast MRI on 01/14/19 showed a single area of non-mass enhancement in the later left breast.    02/14/2019 Surgery   Left lumpectomy [name]): LCIS, 0.7cm, with no evidence of invasive carcinoma.   03/20/2019 -  Anti-estrogen oral therapy   Tamoxifen 10mg  daily, stopped because of frequent UTIs     CHIEF COMPLIANT: Follow-up of LCIS    INTERVAL HISTORY: Faith Mcmahon is a 81 year old with above-mentioned history of LCIS who underwent lumpectomy in 2020 and could not tolerate tamoxifen because of frequent UTIs.  She is here for annual follow-up and reports no new problems or concerns.  She is tolerating tamoxifen fairly well.  Denies any major hot flashes or arthralgias or myalgias.  Denies any lumps or nodules in the breast.  She would like to know if she can take estradiol cream vaginally to prevent frequent UTIs.   ALLERGIES:  is allergic to penicillins, ciprofloxacin, nsaids, prolia [denosumab], and tetanus toxoids.  MEDICATIONS:  Current Outpatient Medications  Medication Sig Dispense Refill   aspirin 81 MG tablet Take 81 mg by mouth daily.     bisoprolol-hydrochlorothiazide (ZIAC) 5-6.25 MG tablet Take 1 tablet by mouth daily. 90 tablet 2   calcium carbonate 200 MG capsule Take 250 mg by mouth 2 (two) times daily with a meal.     cholecalciferol (VITAMIN D) 1000 UNITS tablet Take 1,000 Units by mouth daily.     CRANBERRY FRUIT PO Take 1 tablet by mouth 2 (two) times daily.     levothyroxine (SYNTHROID, LEVOTHROID) 50 MCG tablet SAT- SUN     levothyroxine  (SYNTHROID, LEVOTHROID) 75 MCG tablet MON- FRI     losartan (COZAAR) 50 MG tablet Take 1 tablet (50 mg total) by mouth daily. 90 tablet 3   Methylcellulose, Laxative, (CITRUCEL PO) Take 2 tablets by mouth daily.     Multiple Vitamin (MULTIVITAMIN) capsule Take 1 capsule by mouth daily.     omeprazole (PRILOSEC) 20 MG capsule Take 20 mg by mouth daily.     Probiotic Product (PROBIOTIC ADVANCED PO) Take 1 capsule by mouth 2 (two) times daily.     rosuvastatin (CRESTOR) 10 MG tablet Take 10 mg by mouth every other day.      tamoxifen (NOLVADEX) 10 MG tablet Take 1 tablet (10 mg total) by mouth daily. 90 tablet 3   vitamin B-12 (CYANOCOBALAMIN) 1000 MCG tablet Take 1,000 mcg by mouth daily.     No current facility-administered medications for this visit.    PHYSICAL EXAMINATION: ECOG PERFORMANCE STATUS: 1 - Symptomatic but completely ambulatory  Vitals:   06/23/21 0812  BP: (!) 147/79  Pulse: 63  Resp: 18  Temp: (!) 97.2 F (36.2 C)  SpO2: 98%   Filed Weights   06/23/21 0812  Weight: 155 lb 6.4 oz (70.5 kg)    BREAST: No palpable masses or nodules in either right or left breasts. No palpable axillary supraclavicular or infraclavicular adenopathy no breast tenderness or nipple discharge. (exam performed in the presence of a chaperone)  LABORATORY DATA:  I have reviewed the data as listed CMP Latest  Ref Rng & Units 05/01/2019 04/23/2019 02/11/2019  Glucose 65 - 99 mg/dL 142(H) 107(H) 104(H)  BUN 8 - 27 mg/dL 18 17 13   Creatinine 0.57 - 1.00 mg/dL 0.86 0.78 0.74  Sodium 134 - 144 mmol/L 139 141 139  Potassium 3.5 - 5.2 mmol/L 4.8 4.7 4.0  Chloride 96 - 106 mmol/L 100 102 102  CO2 20 - 29 mmol/L 26 24 28   Calcium 8.7 - 10.3 mg/dL 9.8 9.8 9.6      ASSESSMENT & PLAN:  Lobular carcinoma in situ (LCIS) of left breast Left lumpectomy with Dr. [name] on 02/14/19 for which pathology showed LCIS, 0.7cm, with no evidence of invasive carcinoma. cumulative risk lifetime would be around  23.4% based upon Tyer Cusick model    Risk reduction: Tamoxifen 10 mg daily started 03/20/2019 discontinued November 2020 because of severe vaginal yeast infection that did not get better in spite of multiple treatments.  It finally got better after she discontinued tamoxifen. She would like to use small amount of estrogen cream to prevent the frequent UTIs.  Breast cancer surveillance: Mammogram 10/28/2020: at [facility], Benign Density C Breast MRI 05/20/2021: stable 8 mm linear NME Breast Exam 06/23/21: Benign   We will plan to do breast MRIs every other year from now on.  Her next MRI will be in December 2024 Return to clinic in 1 year for follow-up   No orders of the defined types were placed in this encounter.  The patient has a good understanding of the overall plan. she agrees with it. she will call with any problems that may develop before the next visit here. Total time spent: 30 mins including face to face time and time spent for planning, charting and co-ordination of care   Harriette Ohara, MD 06/23/21

## 2021-06-24 DIAGNOSIS — D1801 Hemangioma of skin and subcutaneous tissue: Secondary | ICD-10-CM | POA: Diagnosis not present

## 2021-06-24 DIAGNOSIS — L820 Inflamed seborrheic keratosis: Secondary | ICD-10-CM | POA: Diagnosis not present

## 2021-06-24 DIAGNOSIS — Z85828 Personal history of other malignant neoplasm of skin: Secondary | ICD-10-CM | POA: Diagnosis not present

## 2021-06-24 DIAGNOSIS — L821 Other seborrheic keratosis: Secondary | ICD-10-CM | POA: Diagnosis not present

## 2021-10-24 ENCOUNTER — Encounter: Payer: Self-pay | Admitting: Hematology and Oncology

## 2021-10-24 DIAGNOSIS — Z1231 Encounter for screening mammogram for malignant neoplasm of breast: Secondary | ICD-10-CM | POA: Diagnosis not present

## 2021-11-04 DIAGNOSIS — R7303 Prediabetes: Secondary | ICD-10-CM | POA: Diagnosis not present

## 2021-11-04 DIAGNOSIS — E039 Hypothyroidism, unspecified: Secondary | ICD-10-CM | POA: Diagnosis not present

## 2021-11-04 DIAGNOSIS — I10 Essential (primary) hypertension: Secondary | ICD-10-CM | POA: Diagnosis not present

## 2021-11-09 DIAGNOSIS — M810 Age-related osteoporosis without current pathological fracture: Secondary | ICD-10-CM | POA: Diagnosis not present

## 2021-11-09 DIAGNOSIS — G250 Essential tremor: Secondary | ICD-10-CM | POA: Diagnosis not present

## 2021-11-09 DIAGNOSIS — K219 Gastro-esophageal reflux disease without esophagitis: Secondary | ICD-10-CM | POA: Diagnosis not present

## 2021-11-09 DIAGNOSIS — E039 Hypothyroidism, unspecified: Secondary | ICD-10-CM | POA: Diagnosis not present

## 2021-11-09 DIAGNOSIS — Z0000 Encounter for general adult medical examination without abnormal findings: Secondary | ICD-10-CM | POA: Diagnosis not present

## 2021-11-09 DIAGNOSIS — I6529 Occlusion and stenosis of unspecified carotid artery: Secondary | ICD-10-CM | POA: Diagnosis not present

## 2021-11-09 DIAGNOSIS — M62519 Muscle wasting and atrophy, not elsewhere classified, unspecified shoulder: Secondary | ICD-10-CM | POA: Diagnosis not present

## 2021-11-09 DIAGNOSIS — I119 Hypertensive heart disease without heart failure: Secondary | ICD-10-CM | POA: Diagnosis not present

## 2021-11-09 DIAGNOSIS — H9190 Unspecified hearing loss, unspecified ear: Secondary | ICD-10-CM | POA: Diagnosis not present

## 2021-11-09 DIAGNOSIS — R7303 Prediabetes: Secondary | ICD-10-CM | POA: Diagnosis not present

## 2021-11-09 DIAGNOSIS — I10 Essential (primary) hypertension: Secondary | ICD-10-CM | POA: Diagnosis not present

## 2021-11-22 NOTE — Progress Notes (Unsigned)
    Subjective:    CC: R/L shoulder pain  I, Molly Weber, LAT, ATC, am serving as scribe for Dr. [name].  HPI: Pt is a 81 y/o female presenting w/ c/o R/L shoulder pain and deltoid atrophy.  She locates her pain to .  Radiating pain: Shoulder mechanical symptoms: Aggravating factors: Treatments tried:   Pertinent review of Systems: ***  Relevant historical information: ***   Objective:   There were no vitals filed for this visit. General: Well Developed, well nourished, and in no acute distress.   MSK: ***  Lab and Radiology Results No results found for this or any previous visit (from the past 72 hour(s)). No results found.    Impression and Recommendations:    Assessment and Plan: 81 y.o. female with ***.  PDMP not reviewed this encounter. No orders of the defined types were placed in this encounter.  No orders of the defined types were placed in this encounter.   Discussed warning signs or symptoms. Please see discharge instructions. Patient expresses understanding.   ***

## 2021-11-23 ENCOUNTER — Encounter: Payer: Self-pay | Admitting: Family Medicine

## 2021-11-23 ENCOUNTER — Ambulatory Visit: Payer: Medicare Other | Admitting: Family Medicine

## 2021-11-23 ENCOUNTER — Ambulatory Visit (INDEPENDENT_AMBULATORY_CARE_PROVIDER_SITE_OTHER): Payer: Medicare Other

## 2021-11-23 ENCOUNTER — Ambulatory Visit: Payer: Self-pay

## 2021-11-23 VITALS — BP 146/72 | HR 60 | Ht 66.0 in | Wt 154.2 lb

## 2021-11-23 DIAGNOSIS — M47812 Spondylosis without myelopathy or radiculopathy, cervical region: Secondary | ICD-10-CM | POA: Diagnosis not present

## 2021-11-23 DIAGNOSIS — R059 Cough, unspecified: Secondary | ICD-10-CM | POA: Diagnosis not present

## 2021-11-23 DIAGNOSIS — M25511 Pain in right shoulder: Secondary | ICD-10-CM | POA: Diagnosis not present

## 2021-11-23 DIAGNOSIS — G8929 Other chronic pain: Secondary | ICD-10-CM

## 2021-11-23 DIAGNOSIS — M19011 Primary osteoarthritis, right shoulder: Secondary | ICD-10-CM | POA: Diagnosis not present

## 2021-11-23 DIAGNOSIS — M62519 Muscle wasting and atrophy, not elsewhere classified, unspecified shoulder: Secondary | ICD-10-CM

## 2021-11-23 DIAGNOSIS — M4802 Spinal stenosis, cervical region: Secondary | ICD-10-CM | POA: Diagnosis not present

## 2021-11-23 DIAGNOSIS — M62511 Muscle wasting and atrophy, not elsewhere classified, right shoulder: Secondary | ICD-10-CM | POA: Diagnosis not present

## 2021-11-23 NOTE — Patient Instructions (Signed)
Nice to meet you today.  Please get an Xray today before you leave.  I've referred you to neurology to get a nerve conduction test.  Follow-up after your nerve test.

## 2021-11-24 NOTE — Progress Notes (Signed)
Cervical spine x-ray shows some arthritis changes throughout the spine.  No fractures are visible.

## 2021-11-24 NOTE — Progress Notes (Signed)
Chest x-ray looks normal to radiology.

## 2021-11-24 NOTE — Progress Notes (Signed)
Right shoulder x-ray shows minimal arthritis changes.

## 2021-12-21 DIAGNOSIS — D1801 Hemangioma of skin and subcutaneous tissue: Secondary | ICD-10-CM | POA: Diagnosis not present

## 2021-12-21 DIAGNOSIS — Z85828 Personal history of other malignant neoplasm of skin: Secondary | ICD-10-CM | POA: Diagnosis not present

## 2021-12-21 DIAGNOSIS — L821 Other seborrheic keratosis: Secondary | ICD-10-CM | POA: Diagnosis not present

## 2022-01-13 ENCOUNTER — Ambulatory Visit: Payer: Medicare Other | Admitting: Physical Medicine and Rehabilitation

## 2022-01-13 ENCOUNTER — Encounter: Payer: Self-pay | Admitting: Physical Medicine and Rehabilitation

## 2022-01-13 DIAGNOSIS — R202 Paresthesia of skin: Secondary | ICD-10-CM

## 2022-01-13 DIAGNOSIS — M62519 Muscle wasting and atrophy, not elsewhere classified, unspecified shoulder: Secondary | ICD-10-CM

## 2022-01-13 NOTE — Progress Notes (Signed)
Pt state right shoulder has an stinking spot. 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 Pt state she notice at the end of april the indent on the top part of right shoulder. Pt state she right handed.  Numeric Pain Rating Scale and Functional Assessment Average Pain 2   In the last MONTH (on 0-10 scale) has pain interfered with the following?  1. General activity like being  able to carry out your everyday physical activities such as walking, climbing stairs, carrying groceries, or moving a chair?  Rating(6)   -BT

## 2022-01-19 NOTE — Progress Notes (Signed)
Faith Mcmahon - 81 y.o. female MRN [id number]  Date of birth: 1940-11-27  Office Visit Note: Visit Date: 01/13/2022 PCP: Deland Pretty, MD Referred by: Gregor Hams, MD  Subjective: Chief Complaint  Patient presents with   Right Shoulder - Other   HPI:  Faith Mcmahon is a 81 y.o. female who comes in today at the request of Dr. [name] for electrodiagnostic study of the Right upper extremities.  Patient is Right hand dominant.  She reports about 4 months of atrophy or having a spot in the right deltoid region that was indented.  She denies any specific trauma or any known insect bite or other issue affecting the right shoulder or skin.  She has not had recent injection or immunization.  She does get her immunizations in the right arm but has not had one recently.  She denies any paresthesias numbness or tingling.  Does have some neck pain at times.  Denies any significant history of pain of the right shoulder specifically.  Does not note any distal weakness.  Has not had electrodiagnostic studies in the past.  X-rays of the cervical spine show degenerative changes particular at C5-6.  No cervical MRI noted.   ROS Otherwise per HPI.  Assessment & Plan: Visit Diagnoses:    ICD-10-CM   1. Paresthesia of skin  R20.2 NCV with EMG (electromyography)    2. Atrophy of deltoid muscle  M62.519       Plan: Impression: The above electrodiagnostic study is ABNORMAL and reveals evidence of a mild right median nerve entrapment at the wrist affecting sensory components and seemingly asymptomatic.   There is no significant electrodiagnostic evidence of any other focal nerve entrapment, brachial plexopathy or cervical radiculopathy.  Typically if there is enough nerve damage to cause atrophy of the muscle we would pick this up on needle EMG and the needle EMG was normal.  Recommendations: 1.  Follow-up with referring physician. 2.  Continue current management of symptoms.  Consider  ultrasound imaging of the localized atrophy versus MRI of the shoulder.   Meds & Orders: No orders of the defined types were placed in this encounter.   Orders Placed This Encounter  Procedures   NCV with EMG (electromyography)    Follow-up: Return in about 2 weeks (around 01/27/2022) for [name], MD.   Procedures: No procedures performed   EMG & NCV Findings: Evaluation of the right radial motor nerve showed prolonged distal onset latency (3.4 ms) and decreased conduction velocity (Up Arm-8cm, 55 m/s).  The right median (across palm) sensory nerve showed prolonged distal peak latency (Wrist, 4.0 ms) and prolonged distal peak latency (Palm, 2.3 ms).  All remaining nerves (as indicated in the following tables) were within normal limits.    All examined muscles (as indicated in the following table) showed no evidence of electrical instability.    Impression: The above electrodiagnostic study is ABNORMAL and reveals evidence of a mild right median nerve entrapment at the wrist affecting sensory components and seemingly asymptomatic.   There is no significant electrodiagnostic evidence of any other focal nerve entrapment, brachial plexopathy or cervical radiculopathy.  Typically if there is enough nerve damage to cause atrophy of the muscle we would pick this up on needle EMG and the needle EMG was normal.  Recommendations: 1.  Follow-up with referring physician. 2.  Continue current management of symptoms.  Consider ultrasound imaging of the localized atrophy versus MRI of the shoulder.  ___________________________ [facility]  Board Certified, Tax adviser of Physical Medicine and Rehabilitation    Nerve Conduction Studies Anti Sensory Summary Table   Stim Site NR Peak (ms) Norm Peak (ms) P-T Amp (V) Norm P-T Amp Site1 Site2 Delta-P (ms) Dist (cm) Vel (m/s) Norm Vel (m/s)  Right Median Acr Palm Anti Sensory (2nd Digit)  30.8C  Wrist    *4.0 <3.6 41.6 >10 Wrist Palm 1.7  0.0    Palm    *2.3 <2.0 56.9         Right Radial Anti Sensory (Base 1st Digit)  30C  Wrist    2.3 <3.1 15.0  Wrist Base 1st Digit 2.3 0.0    Right Ulnar Anti Sensory (5th Digit)  30.8C  Wrist    3.5 <3.7 19.6 >15.0 Wrist 5th Digit 3.5 14.0 40 >38   Motor Summary Table   Stim Site NR Onset (ms) Norm Onset (ms) O-P Amp (mV) Norm O-P Amp Site1 Site2 Delta-0 (ms) Dist (cm) Vel (m/s) Norm Vel (m/s)  Right Median Motor (Abd Poll Brev)  30.2C  Wrist    3.5 <4.2 6.4 >5 Elbow Wrist 4.0 20.0 50 >50  Elbow    7.5  3.1         Right Radial Motor (Ext Indicis)  28.8C  8cm    *3.4 <2.5 2.0 >1.7 Up Arm 8cm 4.3 23.5 *55 >60  Up Arm    7.7  0.6         Right Ulnar Motor (Abd Dig Min)  30.3C  Wrist    3.0 <4.2 7.0 >3 B Elbow Wrist 3.4 20.0 59 >53  B Elbow    6.4  9.0  A Elbow B Elbow 1.3 10.0 77 >53  A Elbow    7.7  7.3          EMG   Side Muscle Nerve Root Ins Act Fibs Psw Amp Dur Poly Recrt Int Fraser Din Comment  Right Abd Poll Brev Median C8-T1 Nml Nml Nml Nml Nml 0 Nml Nml   Right 1stDorInt Ulnar C8-T1 Nml Nml Nml Nml Nml 0 Nml Nml   Right PronatorTeres Median C6-7 Nml Nml Nml Nml Nml 0 Nml Nml   Right Biceps Musculocut C5-6 Nml Nml Nml Nml Nml 0 Nml Nml   Right Deltoid Axillary C5-6 Nml Nml Nml Nml Nml 0 Nml Nml     Nerve Conduction Studies Anti Sensory Left/Right Comparison   Stim Site L Lat (ms) R Lat (ms) L-R Lat (ms) L Amp (V) R Amp (V) L-R Amp (%) Site1 Site2 L Vel (m/s) R Vel (m/s) L-R Vel (m/s)  Median Acr Palm Anti Sensory (2nd Digit)  30.8C  Wrist  *4.0   41.6  Wrist Palm     Palm  *2.3   56.9        Radial Anti Sensory (Base 1st Digit)  30C  Wrist  2.3   15.0  Wrist Base 1st Digit     Ulnar Anti Sensory (5th Digit)  30.8C  Wrist  3.5   19.6  Wrist 5th Digit  40    Motor Left/Right Comparison   Stim Site L Lat (ms) R Lat (ms) L-R Lat (ms) L Amp (mV) R Amp (mV) L-R Amp (%) Site1 Site2 L Vel (m/s) R Vel (m/s) L-R Vel (m/s)  Median Motor (Abd Poll Brev)  30.2C  Wrist   3.5   6.4  Elbow Wrist  50   Elbow  7.5   3.1  Radial Motor (Ext Indicis)  28.8C  8cm  *3.4   2.0  Up Arm 8cm  *55   Up Arm  7.7   0.6        Ulnar Motor (Abd Dig Min)  30.3C  Wrist  3.0   7.0  B Elbow Wrist  59   B Elbow  6.4   9.0  A Elbow B Elbow  77   A Elbow  7.7   7.3           Waveforms:              Clinical History: No specialty comments available.     Objective:  VS:  HT:    WT:   BMI:     BP:   HR: bpm  TEMP: ( )  RESP:  Physical Exam Musculoskeletal:        General: Deformity present. No swelling or tenderness.     Comments: Looking at both shoulders does not really show any symmetric atrophy in general but there is a focal area on the right in the anterior lateral aspect of the deltoid but that indentation that almost appears like atrophy subcutaneously.  There is no swelling or induration or redness.  No real tenderness.  Inspection reveals no atrophy of the bilateral APB or FDI or hand intrinsics. There is no swelling, color changes, allodynia or dystrophic changes. There is 5 out of 5 strength in the bilateral wrist extension, finger abduction and long finger flexion.  There seems to be 5 out of 5 strength in shoulder abduction and flexion extension.  There is no scapular winging.  There is intact sensation to light touch in all dermatomal and peripheral nerve distributions. There is a negative Hoffmann's test bilaterally.  Skin:    General: Skin is warm and dry.     Findings: No erythema or rash.  Neurological:     General: No focal deficit present.     Mental Status: She is alert and oriented to person, place, and time.     Motor: No weakness or abnormal muscle tone.     Coordination: Coordination normal.  Psychiatric:        Mood and Affect: Mood normal.        Behavior: Behavior normal.      Imaging: No results found.

## 2022-03-13 ENCOUNTER — Ambulatory Visit: Payer: Medicare Other | Admitting: Family Medicine

## 2022-03-13 ENCOUNTER — Ambulatory Visit: Payer: Self-pay

## 2022-03-13 VITALS — BP 168/88 | HR 56 | Ht 66.0 in | Wt 156.0 lb

## 2022-03-13 DIAGNOSIS — M62519 Muscle wasting and atrophy, not elsewhere classified, unspecified shoulder: Secondary | ICD-10-CM

## 2022-03-13 MED ORDER — LORAZEPAM 0.5 MG PO TABS: ORAL_TABLET | ORAL | 0 refills | Status: DC

## 2022-03-13 NOTE — Patient Instructions (Addendum)
Thank you for coming in today.   You should hear from MRI scheduling within 1 week. If you do not hear please let me know.   Check back after MRI  

## 2022-03-13 NOTE — Progress Notes (Signed)
I, Peterson Lombard, LAT, ATC acting as a scribe for [name], MD.  Faith Mcmahon is a 81 y.o. 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 Pt was last seen by Dr. Georgina Snell on 11/23/21 and a NCV study was ordered and imaging was obtained. Today, pt reports numbness over the lateral aspect of the R deltoid that has deepened with worsening atrophy.  Dx testing: 01/13/22 NCV study  11/23/21 R shoulder, chest, & c-spine XR  Pertinent review of systems: No fevers or chills  Relevant historical information: History of breast cancer.   Exam:  BP (!) 168/88   Pulse (!) 56   Ht '5\' 6"'$  (1.676 m)   Wt 156 lb (70.8 kg)   SpO2 98%   BMI 25.18 kg/m  General: Well Developed, well nourished, and in no acute distress.   MSK: Right shoulder: Significant deltoid atrophy with a depression in the central portion of the deltoid associated with numbness to palpation.  Decreased shoulder strength abduction.    Lab and Radiology Results  Diagnostic Limited MSK Ultrasound of: Right shoulder Biceps tendon is intact and normal appearing Subscapularis tendon is intact Supraspinatus tendon is intact. Infraspinatus tendon is intact. AC joint mild degenerative appearing No obvious structure in the supracondylar notch although partially visible only with ultrasound. No obvious muscle abnormality at the area of maximum depression of the lateral deltoid. Impression: Aside from muscle atrophy no clear abnormality on MSK ultrasound visible.  EXAM: RIGHT SHOULDER - 2+ VIEW   COMPARISON:  None Available.   FINDINGS: Minimal inferior glenohumeral spur formation. Otherwise, unremarkable bones and soft tissues.   IMPRESSION: Minimal glenohumeral degenerative changes.     Electronically Signed   By: Claudie Revering M.D.   On: 11/24/2021 09:59     EXAM: CERVICAL SPINE - 2-3 VIEW   COMPARISON:  None.    FINDINGS: Mild anterior spur formation and moderate disc space narrowing at multiple levels. Minimal posterior spur formation at the C3-4 level. There are no oblique images for assessing the neural foramina. Normal alignment.   IMPRESSION: Multilevel degenerative changes.     Electronically Signed   By: Claudie Revering M.D.   On: 11/24/2021 10:00 I, [name], personally (independently) visualized and performed the interpretation of the images attached in this note.    Assessment and Plan: 81 y.o. female with right deltoid atrophy and numbness.  This is concerning for an axillary nerve injury or supracondylar notch mass or injury.  Her nerve conduction study was nondiagnostic showing no significant nerve impingement or injury to the deltoid muscle group which was unexpected. This point plan for MRI of the shoulder and the cervical spine to try to explain the degree of atrophy that she is experiencing. Recheck after MRI.   PDMP reviewed during this encounter. Orders Placed This Encounter  Procedures   [place] LIMITED JOINT SPACE STRUCTURES UP RIGHT(NO LINKED CHARGES)    Order Specific Question:   Reason for Exam (SYMPTOM  OR DIAGNOSIS REQUIRED)    Answer:   right shoulder pain    Order Specific Question:   Preferred imaging location?    Answer:   [facility]   MR SHOULDER RIGHT WO CONTRAST    Standing Status:   Future    Standing Expiration Date:   03/14/2023    Order Specific Question:   What is the patient's sedation requirement?    Answer:  Anti-anxiety    Order Specific Question:   Does the patient have a pacemaker or implanted devices?    Answer:   No    Order Specific Question:   Preferred imaging location?    Answer:   [facility] (table limit-550lbs)   MR CERVICAL SPINE WO CONTRAST    Standing Status:   Future    Standing Expiration Date:   04/13/2022    Order Specific Question:   What is the patient's sedation requirement?    Answer:    Anti-anxiety    Order Specific Question:   Does the patient have a pacemaker or implanted devices?    Answer:   No    Order Specific Question:   Preferred imaging location?    Answer:   [facility] (table limit-[address])   Meds ordered this encounter  Medications   LORazepam (ATIVAN) 0.5 MG tablet    Sig: 1-2 tabs 30 - 60 min prior to MRI. Do not drive with this medicine.    Dispense:  4 tablet    Refill:  0     Discussed warning signs or symptoms. Please see discharge instructions. Patient expresses understanding.   The above documentation has been reviewed and is accurate and complete [name], M.D.

## 2022-03-14 DIAGNOSIS — R8271 Bacteriuria: Secondary | ICD-10-CM | POA: Diagnosis not present

## 2022-03-14 DIAGNOSIS — N3020 Other chronic cystitis without hematuria: Secondary | ICD-10-CM | POA: Diagnosis not present

## 2022-03-31 DIAGNOSIS — Z23 Encounter for immunization: Secondary | ICD-10-CM | POA: Diagnosis not present

## 2022-04-04 ENCOUNTER — Ambulatory Visit
Admission: RE | Admit: 2022-04-04 | Discharge: 2022-04-04 | Disposition: A | Discharge status: Home / Self Care | Payer: Medicare Other | Source: Ambulatory Visit | Attending: Family Medicine | Admitting: Family Medicine

## 2022-04-04 DIAGNOSIS — M19011 Primary osteoarthritis, right shoulder: Secondary | ICD-10-CM | POA: Diagnosis not present

## 2022-04-04 DIAGNOSIS — M62519 Muscle wasting and atrophy, not elsewhere classified, unspecified shoulder: Secondary | ICD-10-CM

## 2022-04-04 DIAGNOSIS — M4802 Spinal stenosis, cervical region: Secondary | ICD-10-CM | POA: Diagnosis not present

## 2022-04-04 DIAGNOSIS — M75101 Unspecified rotator cuff tear or rupture of right shoulder, not specified as traumatic: Secondary | ICD-10-CM | POA: Diagnosis not present

## 2022-04-04 DIAGNOSIS — R531 Weakness: Secondary | ICD-10-CM | POA: Diagnosis not present

## 2022-04-04 DIAGNOSIS — M79601 Pain in right arm: Secondary | ICD-10-CM | POA: Diagnosis not present

## 2022-04-07 NOTE — Progress Notes (Signed)
Patient informed of results and scheduled for an appointment to go over them

## 2022-04-07 NOTE — Progress Notes (Signed)
Cervical spine MRI shows multiple areas where the nerves could be pinched causing some of the weakness that you are experiencing. Recommend return to clinic to go over the results in full detail.

## 2022-04-07 NOTE — Progress Notes (Signed)
Right shoulder MRI shows some arthritis changes in the partial rotator cuff tear but nothing to explain the degree of muscle weakness that we saw on exam in the day.  Recommend return to clinic to go over the results in full detail.

## 2022-04-10 ENCOUNTER — Ambulatory Visit: Payer: Medicare Other | Admitting: Family Medicine

## 2022-04-12 ENCOUNTER — Ambulatory Visit: Payer: Medicare Other | Admitting: Family Medicine

## 2022-04-12 ENCOUNTER — Encounter: Payer: Self-pay | Admitting: Family Medicine

## 2022-04-12 VITALS — BP 132/72 | HR 58 | Ht 66.0 in | Wt 158.0 lb

## 2022-04-12 DIAGNOSIS — M25511 Pain in right shoulder: Secondary | ICD-10-CM | POA: Diagnosis not present

## 2022-04-12 NOTE — Patient Instructions (Signed)
Thank you for coming in today.   I've referred you to Physical Therapy.  Let us know if you don't hear from them in one week.   Recheck in about 6 weeks.   We can do more if needed including injection here for your shoulder or an injection in your neck at the radiologist office.

## 2022-04-12 NOTE — Progress Notes (Signed)
I, Faith Mcmahon, LAT, ATC acting as a scribe for Faith Leader, MD.  Faith Mcmahon is a 81 y.o. female who presents to [place] at [facility] today for f/u R deltoid atrophy and MRI review. Pt was last seen by Dr. Georgina Snell on 03/13/22 and was advised to proceed to MRI to further characterize the cause. Today, pt reports that her pain is the same as last visit. Fatigues easily when performing ADLs.  She notes pain in the right lateral upper arm and as above fatigue with upper arm motion.  Pain is worse with overhead motion reaching back at at bedtime.  She denies pain radiating down arm below the elbow.  Dx testing: 04/04/22 R shoulder & c-spine MRI 01/13/22 NCV study             11/23/21 R shoulder, chest, & c-spine XR  Pertinent review of systems: No fevers or chills  Relevant historical information: History of breast cancer on the left side.  History of right rotator cuff repair about 20 years ago.   Exam:  BP 132/72   Pulse (!) 58   Ht '5\' 6"'$  (1.676 m)   Wt 158 lb (71.7 kg)   SpO2 98%   BMI 25.50 kg/m  General: Well Developed, well nourished, and in no acute distress.   MSK: Right shoulder atrophy or divot in the deltoid region.  Normal shoulder motion pain with abduction.  Weakness to abduction.  Positive Hawkins and Neer's test.    Lab and Radiology Results  EXAM: MRI CERVICAL SPINE WITHOUT CONTRAST   TECHNIQUE: Multiplanar, multisequence MR imaging of the cervical spine was performed. No intravenous contrast was administered.   COMPARISON:  None Available.   FINDINGS: Alignment: Physiologic.   Vertebrae: No acute fracture, evidence of discitis, or aggressive bone lesion.   Cord: Normal signal and morphology.   Posterior Fossa, vertebral arteries, paraspinal tissues: Posterior fossa demonstrates no focal abnormality. Vertebral artery flow voids are maintained. Paraspinal soft tissues are unremarkable.   Disc levels:   Discs: Degenerative  disease with disc height loss at C3-4, C4-5, C5-6.   C2-3: No significant disc bulge. No neural foraminal stenosis. No central canal stenosis.   C3-4: Broad-based disc bulge. Bilateral uncovertebral degenerative changes. Moderate right and mild left foraminal stenosis. No spinal stenosis.   C4-5: Broad-based disc osteophyte complex. Bilateral uncovertebral degenerative changes. Moderate bilateral foraminal stenosis. No spinal stenosis.   C5-6: Broad-based disc osteophyte complex. Bilateral uncovertebral degenerative changes. Moderate bilateral foraminal stenosis. No spinal stenosis.   C6-7: Mild broad-based disc bulge no foraminal or central canal stenosis.   C7-T1: No significant disc bulge. No neural foraminal stenosis. No central canal stenosis.   IMPRESSION: 1. At C3-4 there is a broad-based disc bulge. Bilateral uncovertebral degenerative changes. Moderate right and mild left foraminal stenosis. 2. At C4-5 there is a broad-based disc osteophyte complex. Bilateral uncovertebral degenerative changes. Moderate bilateral foraminal stenosis. 3. At C5-6 there is a broad-based disc osteophyte complex. Bilateral uncovertebral degenerative changes. Moderate bilateral foraminal stenosis. 4. No acute osseous injury of the cervical spine.     Electronically Signed   By: Kathreen Devoid M.D.   On: 04/06/2022 11:25  EXAM: MRI OF THE RIGHT SHOULDER WITHOUT CONTRAST   TECHNIQUE: Multiplanar, multisequence MR imaging of the shoulder was performed. No intravenous contrast was administered.   COMPARISON:  Right shoulder radiographs 11/23/2021   FINDINGS: Rotator cuff: There is horizontal linear increased T2 signal partial-thickness midsubstance tear at the slightly anterior  aspect of the supraspinatus midsubstance tendon footprint insertion, measuring up to 8 mm in AP dimension (sagittal series 9, image 19) and 5 mm in transverse dimension (coronal series 7 images 12  through 14). No tendon retraction. Mild underlying supraspinatus distal critical zone and tendon footprint intermediate T2 signal tendinosis. The infraspinatus, subscapularis, and teres minor are intact.   Muscles: No rotator cuff muscle atrophy, fatty infiltration, or edema.   Biceps long head: The intra-articular long head of the biceps tendon is intact.   Acromioclavicular Joint: There are moderate to high-grade degenerative changes of the acromioclavicular joint including joint space narrowing, subchondral marrow edema, and peripheral osteophytosis. Type II acromion with mild distal lateral subacromial spurring. The distal lateral subacromial space measures 7 mm, mildly narrowed. Trace fluid within the subacromial/subdeltoid bursa.   Glenohumeral Joint: Moderate thinning of the glenohumeral cartilage.   Labrum: Mild peripheral degenerative irregularity of the posterior and superior aspects of the glenoid labrum.   Bones:  No acute fracture.   Other: None.   IMPRESSION: 1. Small partial-thickness midsubstance tear of the slightly anterior aspect of the supraspinatus tendon footprint insertion, measuring up to 8 mm in AP dimension. No tendon retraction. This does not extend through an articular or bursal surface of the tendon and may not be visualized at arthroscopy. 2. Moderate to high-grade degenerative changes of the acromioclavicular joint. 3. Type II acromion with mild distal lateral subacromial spurring and mild narrowing of the distal lateral subacromial space. 4. Trace fluid within the subacromial/subdeltoid bursa.     Electronically Signed   By: Yvonne Kendall M.D.   On: 04/06/2022 10:57    I, Faith Mcmahon, personally (independently) visualized and performed the interpretation of the images attached in this note.  My personal interpretation of the subcutaneous fat on the lateral upper arm shows significant "divot"/atrophy of the subcutaneous fat at the area of  visible on exam.  Assessment and Plan: 81 y.o. female with right shoulder pain with visible deformity right lateral upper arm.  In retrospect the divot in the upper arm is of the subcutaneous fat and may reflect atrophy of the subcutaneous fat.  The deltoid muscle itself looks okay.  The source of her pain is most likely the rotator cuff tendinopathy and partial tear seen on MRI or C5 radiculopathy seen on cervical spine MRI.  Plan for trial of physical therapy.  If not improving consider subacromial injection or epidural steroid injection.  Recheck in about 6 weeks.   PDMP not reviewed this encounter. Orders Placed This Encounter  Procedures   Ambulatory referral to Physical Therapy    Referral Priority:   Routine    Referral Type:   Physical Medicine    Referral Reason:   Specialty Services Required    Requested Specialty:   Physical Therapy    Number of Visits Requested:   1   No orders of the defined types were placed in this encounter.    Discussed warning signs or symptoms. Please see discharge instructions. Patient expresses understanding.   The above documentation has been reviewed and is accurate and complete Faith Mcmahon, M.D.  Total encounter time 30 minutes including face-to-face time with the patient and, reviewing past medical record, and charting on the date of service.

## 2022-04-13 ENCOUNTER — Encounter: Payer: Self-pay | Admitting: Physical Therapy

## 2022-04-13 ENCOUNTER — Ambulatory Visit: Payer: Medicare Other | Admitting: Physical Therapy

## 2022-04-13 DIAGNOSIS — M62519 Muscle wasting and atrophy, not elsewhere classified, unspecified shoulder: Secondary | ICD-10-CM

## 2022-04-13 DIAGNOSIS — M6281 Muscle weakness (generalized): Secondary | ICD-10-CM

## 2022-04-13 DIAGNOSIS — M25511 Pain in right shoulder: Secondary | ICD-10-CM | POA: Diagnosis not present

## 2022-04-13 NOTE — Therapy (Signed)
OUTPATIENT PHYSICAL THERAPY CERVICAL EVALUATION   Patient Name: Faith Mcmahon MRN: [id number] DOB:27-Jul-1940, 81 y.o., female Today's Date: 04/13/2022   PT End of Session - 04/13/22 0843     Visit Number 1    Number of Visits 12    Date for PT Re-Evaluation 05/25/22    Authorization Type [employer] VL med [place]    PT Start Time [phone]    PT Stop Time 0927    PT Time Calculation (min) 40 min             Past Medical History:  Diagnosis Date   Cancer ([place]) 12/2018   left breast cancer   Cerebrovascular disease    Complication of anesthesia    with hysterectomy in 1980   Family history of breast cancer    Family history of ovarian cancer    GERD (gastroesophageal reflux disease)    Hypertension    Hypothyroidism    Obesity    Palpitations    PONV (postoperative nausea and vomiting)    Past Surgical History:  Procedure Laterality Date   ADENOIDECTOMY     BLADDER SURGERY     BREAST LUMPECTOMY WITH RADIOACTIVE SEED LOCALIZATION Left 02/14/2019   Procedure: LEFT BREAST LUMPECTOMY WITH RADIOACTIVE SEED LOCALIZATION;  Surgeon: Alphonsa Overall, MD;  Location: [place];  Service: General;  Laterality: Left;   [place]     Patient Active Problem List   Diagnosis Date Noted   Atrophy of deltoid muscle 11/23/2021   Genetic testing 07/23/2019   Attenuated familial adenomatous polyposis 07/23/2019   Family history of breast cancer    Family history of ovarian cancer    Lobular carcinoma in situ (LCIS) of left breast 03/20/2019   Hypertension 08/17/2011   Cerebrovascular disease 08/17/2011   Hyperlipidemia 08/17/2011   DIZZINESS 01/18/2010    PCP: Deland Pretty, MD  REFERRING PROVIDER: Gregor Hams, MD  REFERRING DIAG: M25.511 (ICD-10-CM) - Pain in joint of right shoulder  THERAPY DIAG:  Atrophy of deltoid muscle  Muscle weakness (generalized)  Acute pain of right shoulder  Rationale for Evaluation and  Treatment: Rehabilitation  ONSET DATE: May 2023  SUBJECTIVE:                                                                                                                                                                                                         SUBJECTIVE STATEMENT: States she has occasional throbbing in her right shoulder, but otherwise no pain. States that  she noticed an indent in her right arm May more or less the same since then. Difficulties with overhead activities like washing hair. States that she got a stationary bike and was pulling on the moveable handles and didn't know it that contributed and she has had it for 2 years. Wants to get back to the [facility]  Fatigues easily when performing ADLs.  She notes pain in the right lateral upper arm and as above fatigue with upper arm motion.  Pain is worse with overhead motion reaching back at at bedtime.  She denies pain radiating down arm below the elbow.  PERTINENT HISTORY:  L breast cancer, R RCR 2020, benign tremors with activities  PAIN:  No pain.  PRECAUTIONS: None  WEIGHT BEARING RESTRICTIONS: No  FALLS:  Has patient fallen in last 6 months? No  LIVING ENVIRONMENT: Lives with: lives alone Lives in: House/apartment  OCCUPATION: retired   PLOF: Independent, enjoys -walking and reading   PATIENT GOALS: improve strength in right arm and prevent it from happening again  OBJECTIVE:   DIAGNOSTIC FINDINGS:  04/04/22 MRI Neck IMPRESSION: 1. At C3-4 there is a broad-based disc bulge. Bilateral uncovertebral degenerative changes. Moderate right and mild left foraminal stenosis. 2. At C4-5 there is a broad-based disc osteophyte complex. Bilateral uncovertebral degenerative changes. Moderate bilateral foraminal stenosis. 3. At C5-6 there is a broad-based disc osteophyte complex. Bilateral uncovertebral degenerative changes. Moderate bilateral foraminal stenosis. 4. No acute osseous injury of the cervical  spine.   R Shoulder IMPRESSION: 1. Small partial-thickness midsubstance tear of the slightly anterior aspect of the supraspinatus tendon footprint insertion, measuring up to 8 mm in AP dimension. No tendon retraction. This does not extend through an articular or bursal surface of the tendon and may not be visualized at arthroscopy. 2. Moderate to high-grade degenerative changes of the acromioclavicular joint. 3. Type II acromion with mild distal lateral subacromial spurring and mild narrowing of the distal lateral subacromial space. 4. Trace fluid within the subacromial/subdeltoid bursa.     COGNITION: Overall cognitive status: Within functional limits for tasks assessed    POSTURE: rounded shoulders, forward head, and flexed trunk   PALPATION: Tenderness to palpation along right levator   CERVICAL ROM:   Active ROM A/PROM (deg) eval  Flexion WNL  Extension 75% limited  Right lateral flexion   Left lateral flexion   Right rotation 50% limited  Left rotation 50% limited   (Blank rows = not tested)    UE Measurements Upper Extremity Right 04/13/2022 Left 04/13/2022   A/PROM MMT A/PROM MMT  Shoulder Flexion 135 3+ 140 3+  Shoulder Extension      Shoulder Abduction 120 3+ 130 3+  Shoulder Adduction      Shoulder Internal Rotation Reaches to T12 SP 3+ Reaches to T12 SReaShoulder Internal Rotation Reaches to T12 SP  Reaches to T12 SP 3+  Shoulder External Rotation T2SP  3+ T2 SP  3+  Elbow Flexion      Elbow Extension      Wrist Flexion      Wrist Extension      Wrist Supination      Wrist Pronation      Wrist Ulnar Deviation      Wrist Radial Deviation      Grip Strength NA  NA     (Blank rows = not tested)   * pain Right handed   CERVICAL SPECIAL TESTS:  Spurling's test: Positive   TODAY'S TREATMENT:  DATE: 04/13/2022 Therapeutic  Exercise:  Aerobic: Supine: shoulder ER red band 2x10 B, shoulder flexion with dowel and post tilt x15 B, bench plus with dowel 2x10 Prone:  Seated:  Standing: red band - tricep curls x15 B, bicep curls x15 B, rows x15 B Neuromuscular Re-education: Manual Therapy: Therapeutic Activity: Self Care: Trigger Point Dry Needling:  Modalities:     PATIENT EDUCATION:  Education details: on current presentation, on HEP, on clinical outcomes score and POC Person educated: Patient Education method: Consulting civil engineer, Demonstration, and Handouts Education comprehension: verbalized understanding  HOME EXERCISE PROGRAM: [id number]  ASSESSMENT:  CLINICAL IMPRESSION: Patient presents with right shoulder weakness and reports of indentation that occurred in May earlier this year right above her deltoid. No significant findings with imaging. Patient education in findings and POC. Reviewed previous home exercises and cued for proper form ans she has a tendency to abduct her arms with all shoulder exercises right more than left. Patient would greatly benefit from skilled PT to improve overall function and QOL.   OBJECTIVE IMPAIRMENTS: decreased activity tolerance, decreased ROM, decreased strength, postural dysfunction, and pain.   ACTIVITY LIMITATIONS: carrying, lifting, bathing, and reach over head  PARTICIPATION LIMITATIONS: meal prep and cleaning  PERSONAL FACTORS: Age and Time since onset of injury/illness/exacerbation are also affecting patient's functional outcome.   REHAB POTENTIAL: Good  CLINICAL DECISION MAKING: Stable/uncomplicated  EVALUATION COMPLEXITY: Low   GOALS: Goals reviewed with patient? yes  SHORT TERM GOALS: Target date: 05/04/2022  Patient will be independent in self management strategies to improve quality of life and functional outcomes. Baseline: New Program Goal status: INITIAL  2.  Patient will report at least 50% improvement in overall symptoms and/or function to  demonstrate improved functional mobility Baseline: 0% better Goal status: INITIAL  3.  Patient will be able to demonstrate shoulder flexion without lumbar pain or compensations Baseline:  Goal status: INITIAL       LONG TERM GOALS: Target date: 05/25/2022   Patient will report at least 75% improvement in overall symptoms and/or function to demonstrate improved functional mobility Baseline: 0% better Goal status: INITIAL  2.  Patient will be able to wash and style hair without fatigue to improve overhead endurance Baseline:  Goal status: INITIAL  3.  Patient will be able to report returning safely to gym with no pain or difficulties to return to PLOF Baseline:  Goal status: INITIAL  4.  Patient will be able to demonstrate at least 4- MMT in B UE Baseline:  Goal status: INITIAL   PLAN:  PT FREQUENCY: 2x/week  PT DURATION: 6 weeks  PLANNED INTERVENTIONS: Therapeutic exercises, Therapeutic activity, Neuromuscular re-education, Balance training, Gait training, Patient/Family education, Self Care, Joint mobilization, Aquatic Therapy, Dry Needling, Electrical stimulation, Cryotherapy, Moist heat, Vasopneumatic device, Traction, Ultrasound, Ionotophoresis '4mg'$ /ml Dexamethasone, Manual therapy, and Re-evaluation  PLAN FOR NEXT SESSION: shoulder ROM and strengthening, posture/thoracic mobility   9:57 AM, 04/13/22 Jerene Pitch, DPT Physical Therapy with [facility]

## 2022-04-17 ENCOUNTER — Encounter: Payer: Self-pay | Admitting: Physical Therapy

## 2022-04-17 ENCOUNTER — Ambulatory Visit: Payer: Medicare Other | Admitting: Physical Therapy

## 2022-04-17 DIAGNOSIS — M6281 Muscle weakness (generalized): Secondary | ICD-10-CM | POA: Diagnosis not present

## 2022-04-17 DIAGNOSIS — M25511 Pain in right shoulder: Secondary | ICD-10-CM | POA: Diagnosis not present

## 2022-04-17 DIAGNOSIS — M62519 Muscle wasting and atrophy, not elsewhere classified, unspecified shoulder: Secondary | ICD-10-CM | POA: Diagnosis not present

## 2022-04-17 NOTE — Therapy (Signed)
OUTPATIENT PHYSICAL THERAPY TREATMENT NOTE   Patient Name: Faith Mcmahon MRN: [id number] DOB:06-29-1940, 81 y.o., female Today's Date: 04/17/2022  PCP: Deland Pretty, MD   REFERRING PROVIDER: Gregor Hams, MD  END OF SESSION:   PT End of Session - 04/17/22 1303     Visit Number 2    Number of Visits 12    Date for PT Re-Evaluation 05/25/22    Authorization Type [employer] VL med [place] [employer]    PT Start Time 1304    PT Stop Time 1344    PT Time Calculation (min) 40 min             Past Medical History:  Diagnosis Date   Cancer ([place]) 12/2018   left breast cancer   Cerebrovascular disease    Complication of anesthesia    with hysterectomy in 1980   Family history of breast cancer    Family history of ovarian cancer    GERD (gastroesophageal reflux disease)    Hypertension    Hypothyroidism    Obesity    Palpitations    PONV (postoperative nausea and vomiting)    Past Surgical History:  Procedure Laterality Date   ADENOIDECTOMY     BLADDER SURGERY     BREAST LUMPECTOMY WITH RADIOACTIVE SEED LOCALIZATION Left 02/14/2019   Procedure: LEFT BREAST LUMPECTOMY WITH RADIOACTIVE SEED LOCALIZATION;  Surgeon: Alphonsa Overall, MD;  Location: [facility];  Service: General;  Laterality: Left;   [place]     Patient Active Problem List   Diagnosis Date Noted   Atrophy of deltoid muscle 11/23/2021   Genetic testing 07/23/2019   Attenuated familial adenomatous polyposis 07/23/2019   Family history of breast cancer    Family history of ovarian cancer    Lobular carcinoma in situ (LCIS) of left breast 03/20/2019   Hypertension 08/17/2011   Cerebrovascular disease 08/17/2011   Hyperlipidemia 08/17/2011   DIZZINESS 01/18/2010       REFERRING DIAG: M25.511 (ICD-10-CM) - Pain in joint of right shoulder   THERAPY DIAG:  Atrophy of deltoid muscle   Muscle weakness (generalized)   Acute pain of right shoulder    Rationale for Evaluation and Treatment: Rehabilitation   ONSET DATE: May 2023   SUBJECTIVE:                                                                                                                                                                                                          SUBJECTIVE STATEMENT:  04/17/2022 States that she sore the first day in her back but has focused on form.   No current soreness.  Eval: States she has occasional throbbing in her right shoulder, but otherwise no pain. States that she noticed an indent in her right arm May more or less the same since then. Difficulties with overhead activities like washing hair. States that she got a stationary bike and was pulling on the moveable handles and didn't know it that contributed and she has had it for 2 years. Wants to get back to the [facility]   Fatigues easily when performing ADLs.  She notes pain in the right lateral upper arm and as above fatigue with upper arm motion.  Pain is worse with overhead motion reaching back at at bedtime.  She denies pain radiating down arm below the elbow.   PERTINENT HISTORY:  L breast cancer, R RCR 2020, benign tremors with activities   PAIN:  No pain.   PRECAUTIONS: None   WEIGHT BEARING RESTRICTIONS: No   FALLS:  Has patient fallen in last 6 months? No   LIVING ENVIRONMENT: Lives with: lives alone Lives in: House/apartment   OCCUPATION: retired    PLOF: Independent, enjoys -walking and reading    PATIENT GOALS: improve strength in right arm and prevent it from happening again   OBJECTIVE:    DIAGNOSTIC FINDINGS:  04/04/22 MRI Neck IMPRESSION: 1. At C3-4 there is a broad-based disc bulge. Bilateral uncovertebral degenerative changes. Moderate right and mild left foraminal stenosis. 2. At C4-5 there is a broad-based disc osteophyte complex. Bilateral uncovertebral degenerative changes. Moderate bilateral foraminal stenosis. 3. At C5-6 there is a  broad-based disc osteophyte complex. Bilateral uncovertebral degenerative changes. Moderate bilateral foraminal stenosis. 4. No acute osseous injury of the cervical spine.   R Shoulder IMPRESSION: 1. Small partial-thickness midsubstance tear of the slightly anterior aspect of the supraspinatus tendon footprint insertion, measuring up to 8 mm in AP dimension. No tendon retraction. This does not extend through an articular or bursal surface of the tendon and may not be visualized at arthroscopy. 2. Moderate to high-grade degenerative changes of the acromioclavicular joint. 3. Type II acromion with mild distal lateral subacromial spurring and mild narrowing of the distal lateral subacromial space. 4. Trace fluid within the subacromial/subdeltoid bursa.       COGNITION: Overall cognitive status: Within functional limits for tasks assessed       POSTURE: rounded shoulders, forward head, and flexed trunk    PALPATION: Tenderness to palpation along right levator           CERVICAL ROM:    Active ROM A/PROM (deg) eval  Flexion WNL  Extension 75% limited  Right lateral flexion    Left lateral flexion    Right rotation 50% limited  Left rotation 50% limited   (Blank rows = not tested)                UE Measurements       Upper Extremity Right 04/13/2022 Left 04/13/2022    A/PROM MMT A/PROM MMT  Shoulder Flexion 135 3+ 140 3+  Shoulder Extension          Shoulder Abduction 120 3+ 130 3+  Shoulder Adduction          Shoulder Internal Rotation Reaches to T12 SP 3+ Reaches to T12 SP 3+  Shoulder External Rotation T2SP  3+ T2 SP  3+  Elbow Flexion          Elbow Extension  Wrist Flexion          Wrist Extension          Wrist Supination          Wrist Pronation          Wrist Ulnar Deviation          Wrist Radial Deviation          Grip Strength NA   NA                          (Blank rows = not tested)                       * pain Right handed    CERVICAL  SPECIAL TESTS:  Spurling's test: Positive     TODAY'S TREATMENT:                                                                                                                              DATE:04/17/2022  Therapeutic Exercise:           Aerobic: Supine:  shoulder flexion with dowel and post tilt 2 1 minutes bouts B  Prone: quadruped  rock back and forth and finger abd - 6 minutes total           Seated: hip abd x20 5" holds           Standing:SHOULDER FLEXION BACK UP against THE WALL X20 5" HOLDS, wall stabilizations shoulder flexion yellow ball 2x10 B up/down, lateral, clockwise and counter clockwise circles,  Neuromuscular Re-education: Manual Therapy: Therapeutic Activity: Self Care: Trigger Point Dry Needling:  Modalities:      PATIENT EDUCATION:  Education details: on HEP, on anatomy and rationale for interventions  Person educated: Patient Education method: Explanation, Demonstration, and Handouts Education comprehension: verbalized understanding   HOME EXERCISE PROGRAM: [id number]   ASSESSMENT:   CLINICAL IMPRESSION: 04/17/2022 Session focused on review of HEP and progressing of home interventions. Tolerated exercises well with no increase in pain. Printed all new exercises off for patient. Will continue with current POC as tolerated.   Eval: Patient presents with right shoulder weakness and reports of indentation that occurred in May earlier this year right above her deltoid. No significant findings with imaging. Patient education in findings and POC. Reviewed previous home exercises and cued for proper form ans she has a tendency to abduct her arms with all shoulder exercises right more than left. Patient would greatly benefit from skilled PT to improve overall function and QOL.     OBJECTIVE IMPAIRMENTS: decreased activity tolerance, decreased ROM, decreased strength, postural dysfunction, and pain.    ACTIVITY LIMITATIONS: carrying, lifting, bathing, and reach over  head   PARTICIPATION LIMITATIONS: meal prep and cleaning   PERSONAL FACTORS: Age and Time since onset of injury/illness/exacerbation are also affecting patient's functional outcome.    REHAB POTENTIAL: Good   CLINICAL DECISION  MAKING: Stable/uncomplicated   EVALUATION COMPLEXITY: Low     GOALS: Goals reviewed with patient? yes   SHORT TERM GOALS: Target date: 05/04/2022  Patient will be independent in self management strategies to improve quality of life and functional outcomes. Baseline: New Program Goal status: INITIAL   2.  Patient will report at least 50% improvement in overall symptoms and/or function to demonstrate improved functional mobility Baseline: 0% better Goal status: INITIAL   3.  Patient will be able to demonstrate shoulder flexion without lumbar pain or compensations Baseline:  Goal status: INITIAL           LONG TERM GOALS: Target date: 05/25/2022    Patient will report at least 75% improvement in overall symptoms and/or function to demonstrate improved functional mobility Baseline: 0% better Goal status: INITIAL   2.  Patient will be able to wash and style hair without fatigue to improve overhead endurance Baseline:  Goal status: INITIAL   3.  Patient will be able to report returning safely to gym with no pain or difficulties to return to PLOF Baseline:  Goal status: INITIAL   4.  Patient will be able to demonstrate at least 4- MMT in B UE Baseline:  Goal status: INITIAL     PLAN:   PT FREQUENCY: 2x/week   PT DURATION: 6 weeks   PLANNED INTERVENTIONS: Therapeutic exercises, Therapeutic activity, Neuromuscular re-education, Balance training, Gait training, Patient/Family education, Self Care, Joint mobilization, Aquatic Therapy, Dry Needling, Electrical stimulation, Cryotherapy, Moist heat, Vasopneumatic device, Traction, Ultrasound, Ionotophoresis '4mg'$ /ml Dexamethasone, Manual therapy, and Re-evaluation   PLAN FOR NEXT SESSION: shoulder  ROM and strengthening, posture/thoracic mobility     1:58 PM, 04/17/22 Jerene Pitch, DPT Physical Therapy with [facility]

## 2022-04-19 ENCOUNTER — Encounter: Payer: Self-pay | Admitting: Physical Therapy

## 2022-04-19 ENCOUNTER — Ambulatory Visit: Payer: Medicare Other | Admitting: Physical Therapy

## 2022-04-19 ENCOUNTER — Encounter: Payer: Medicare Other | Admitting: Physical Therapy

## 2022-04-19 DIAGNOSIS — M25511 Pain in right shoulder: Secondary | ICD-10-CM | POA: Diagnosis not present

## 2022-04-19 DIAGNOSIS — M62519 Muscle wasting and atrophy, not elsewhere classified, unspecified shoulder: Secondary | ICD-10-CM

## 2022-04-19 DIAGNOSIS — M6281 Muscle weakness (generalized): Secondary | ICD-10-CM

## 2022-04-19 NOTE — Therapy (Signed)
OUTPATIENT PHYSICAL THERAPY TREATMENT NOTE   Patient Name: Faith Mcmahon MRN: [id number] DOB:15-Jun-1940, 81 y.o., female Today's Date: 04/19/2022  PCP: Deland Pretty, MD   REFERRING PROVIDER: Gregor Hams, MD  END OF SESSION:   PT End of Session - 04/19/22 0935     Visit Number 3    Number of Visits 12    Date for PT Re-Evaluation 05/25/22    Authorization Type [employer] VL med [place] [employer]    PT Start Time [phone]    PT Stop Time 5397    PT Time Calculation (min) 38 min             Past Medical History:  Diagnosis Date   Cancer ([place]) 12/2018   left breast cancer   Cerebrovascular disease    Complication of anesthesia    with hysterectomy in 1980   Family history of breast cancer    Family history of ovarian cancer    GERD (gastroesophageal reflux disease)    Hypertension    Hypothyroidism    Obesity    Palpitations    PONV (postoperative nausea and vomiting)    Past Surgical History:  Procedure Laterality Date   ADENOIDECTOMY     BLADDER SURGERY     BREAST LUMPECTOMY WITH RADIOACTIVE SEED LOCALIZATION Left 02/14/2019   Procedure: LEFT BREAST LUMPECTOMY WITH RADIOACTIVE SEED LOCALIZATION;  Surgeon: Alphonsa Overall, MD;  Location: [facility];  Service: General;  Laterality: Left;   [place]     Patient Active Problem List   Diagnosis Date Noted   Atrophy of deltoid muscle 11/23/2021   Genetic testing 07/23/2019   Attenuated familial adenomatous polyposis 07/23/2019   Family history of breast cancer    Family history of ovarian cancer    Lobular carcinoma in situ (LCIS) of left breast 03/20/2019   Hypertension 08/17/2011   Cerebrovascular disease 08/17/2011   Hyperlipidemia 08/17/2011   DIZZINESS 01/18/2010       REFERRING DIAG: M25.511 (ICD-10-CM) - Pain in joint of right shoulder   THERAPY DIAG:  Atrophy of deltoid muscle   Muscle weakness (generalized)   Acute pain of right shoulder    Rationale for Evaluation and Treatment: Rehabilitation   ONSET DATE: May 2023   SUBJECTIVE:                                                                                                                                                                                                          SUBJECTIVE STATEMENT:  04/19/2022 No pain or soreness or difficulties with exercises.  Eval: States she has occasional throbbing in her right shoulder, but otherwise no pain. States that she noticed an indent in her right arm May more or less the same since then. Difficulties with overhead activities like washing hair. States that she got a stationary bike and was pulling on the moveable handles and didn't know it that contributed and she has had it for 2 years. Wants to get back to the [facility]   Fatigues easily when performing ADLs.  She notes pain in the right lateral upper arm and as above fatigue with upper arm motion.  Pain is worse with overhead motion reaching back at at bedtime.  She denies pain radiating down arm below the elbow.   PERTINENT HISTORY:  L breast cancer, R RCR 2020, benign tremors with activities   PAIN:  No pain.   PRECAUTIONS: None   WEIGHT BEARING RESTRICTIONS: No   FALLS:  Has patient fallen in last 6 months? No   LIVING ENVIRONMENT: Lives with: lives alone Lives in: House/apartment   OCCUPATION: retired    PLOF: Independent, enjoys -walking and reading    PATIENT GOALS: improve strength in right arm and prevent it from happening again   OBJECTIVE:    DIAGNOSTIC FINDINGS:  04/04/22 MRI Neck IMPRESSION: 1. At C3-4 there is a broad-based disc bulge. Bilateral uncovertebral degenerative changes. Moderate right and mild left foraminal stenosis. 2. At C4-5 there is a broad-based disc osteophyte complex. Bilateral uncovertebral degenerative changes. Moderate bilateral foraminal stenosis. 3. At C5-6 there is a broad-based disc osteophyte complex.  Bilateral uncovertebral degenerative changes. Moderate bilateral foraminal stenosis. 4. No acute osseous injury of the cervical spine.   R Shoulder IMPRESSION: 1. Small partial-thickness midsubstance tear of the slightly anterior aspect of the supraspinatus tendon footprint insertion, measuring up to 8 mm in AP dimension. No tendon retraction. This does not extend through an articular or bursal surface of the tendon and may not be visualized at arthroscopy. 2. Moderate to high-grade degenerative changes of the acromioclavicular joint. 3. Type II acromion with mild distal lateral subacromial spurring and mild narrowing of the distal lateral subacromial space. 4. Trace fluid within the subacromial/subdeltoid bursa.       COGNITION: Overall cognitive status: Within functional limits for tasks assessed       POSTURE: rounded shoulders, forward head, and flexed trunk    PALPATION: Tenderness to palpation along right levator           CERVICAL ROM:    Active ROM A/PROM (deg) eval  Flexion WNL  Extension 75% limited  Right lateral flexion    Left lateral flexion    Right rotation 50% limited  Left rotation 50% limited   (Blank rows = not tested)                UE Measurements       Upper Extremity Right 04/13/2022 Left 04/13/2022    A/PROM MMT A/PROM MMT  Shoulder Flexion 135 3+ 140 3+  Shoulder Extension          Shoulder Abduction 120 3+ 130 3+  Shoulder Adduction          Shoulder Internal Rotation Reaches to T12 SP 3+ Reaches to T12 SP 3+  Shoulder External Rotation T2SP  3+ T2 SP  3+  Elbow Flexion          Elbow Extension          Wrist Flexion  Wrist Extension          Wrist Supination          Wrist Pronation          Wrist Ulnar Deviation          Wrist Radial Deviation          Grip Strength NA   NA                          (Blank rows = not tested)                       * pain Right handed    CERVICAL SPECIAL TESTS:  Spurling's test:  Positive     TODAY'S TREATMENT:                                                                                                                              DATE:04/19/2022  Therapeutic Exercise:           s/l: shoulder ER no weight 1 minute, 2# weight x2 30" bouts -B , shoulder flexion 2x10 B Supine: shoulder protraction x20 5" holds B, chest fly 2x10 B   Prone:            Seated:             Standing: shoulder flexion up wall in pillow case 2x10 B, shoulder ER at wall x2 1 minute bouts, shoulder abd at wall 3x10 B 5" holds   Neuromuscular Re-education: Manual Therapy: Therapeutic Activity: Self Care: Trigger Point Dry Needling:  Modalities:      PATIENT EDUCATION:  Education details: on HEP, on anatomy and rationale for interventions  Person educated: Patient Education method: Explanation, Demonstration, and Handouts Education comprehension: verbalized understanding   HOME EXERCISE PROGRAM: [id number]   ASSESSMENT:   CLINICAL IMPRESSION: 04/19/2022 Reviewed entire HEP and discussed optimal frequency and time spent during stretches and strengthening exercises. Tolerated all new exercises well with occasional cues for form. Exercises more difficult on right side which is to be expected. Will continue with current POC as tolerated.  Eval: Patient presents with right shoulder weakness and reports of indentation that occurred in May earlier this year right above her deltoid. No significant findings with imaging. Patient education in findings and POC. Reviewed previous home exercises and cued for proper form ans she has a tendency to abduct her arms with all shoulder exercises right more than left. Patient would greatly benefit from skilled PT to improve overall function and QOL.     OBJECTIVE IMPAIRMENTS: decreased activity tolerance, decreased ROM, decreased strength, postural dysfunction, and pain.    ACTIVITY LIMITATIONS: carrying, lifting, bathing, and reach over head    PARTICIPATION LIMITATIONS: meal prep and cleaning   PERSONAL FACTORS: Age and Time since onset of injury/illness/exacerbation are also affecting patient's functional outcome.    REHAB POTENTIAL: Good   CLINICAL DECISION  MAKING: Stable/uncomplicated   EVALUATION COMPLEXITY: Low     GOALS: Goals reviewed with patient? yes   SHORT TERM GOALS: Target date: 05/04/2022  Patient will be independent in self management strategies to improve quality of life and functional outcomes. Baseline: New Program Goal status: INITIAL   2.  Patient will report at least 50% improvement in overall symptoms and/or function to demonstrate improved functional mobility Baseline: 0% better Goal status: INITIAL   3.  Patient will be able to demonstrate shoulder flexion without lumbar pain or compensations Baseline:  Goal status: INITIAL           LONG TERM GOALS: Target date: 05/25/2022    Patient will report at least 75% improvement in overall symptoms and/or function to demonstrate improved functional mobility Baseline: 0% better Goal status: INITIAL   2.  Patient will be able to wash and style hair without fatigue to improve overhead endurance Baseline:  Goal status: INITIAL   3.  Patient will be able to report returning safely to gym with no pain or difficulties to return to PLOF Baseline:  Goal status: INITIAL   4.  Patient will be able to demonstrate at least 4- MMT in B UE Baseline:  Goal status: INITIAL     PLAN:   PT FREQUENCY: 2x/week   PT DURATION: 6 weeks   PLANNED INTERVENTIONS: Therapeutic exercises, Therapeutic activity, Neuromuscular re-education, Balance training, Gait training, Patient/Family education, Self Care, Joint mobilization, Aquatic Therapy, Dry Needling, Electrical stimulation, Cryotherapy, Moist heat, Vasopneumatic device, Traction, Ultrasound, Ionotophoresis '4mg'$ /ml Dexamethasone, Manual therapy, and Re-evaluation   PLAN FOR NEXT SESSION: shoulder ROM and  strengthening, posture/thoracic mobility, possibly reduce to 1x/week     10:29 AM, 04/19/22 Jerene Pitch, DPT Physical Therapy with [facility]

## 2022-04-24 ENCOUNTER — Encounter: Payer: Self-pay | Admitting: Physical Therapy

## 2022-04-24 ENCOUNTER — Ambulatory Visit: Payer: Medicare Other | Admitting: Physical Therapy

## 2022-04-24 DIAGNOSIS — M62519 Muscle wasting and atrophy, not elsewhere classified, unspecified shoulder: Secondary | ICD-10-CM | POA: Diagnosis not present

## 2022-04-24 DIAGNOSIS — M25511 Pain in right shoulder: Secondary | ICD-10-CM

## 2022-04-24 DIAGNOSIS — M6281 Muscle weakness (generalized): Secondary | ICD-10-CM | POA: Diagnosis not present

## 2022-04-24 NOTE — Therapy (Signed)
OUTPATIENT PHYSICAL THERAPY TREATMENT NOTE   Patient Name: Faith Mcmahon MRN: [id number] DOB:1941/06/09, 81 y.o., female Today's Date: 04/24/2022  PCP: Deland Pretty, MD   REFERRING PROVIDER: Gregor Hams, MD  END OF SESSION:   PT End of Session - 04/24/22 0934     Visit Number 4    Number of Visits 12    Date for PT Re-Evaluation 05/25/22    Authorization Type [employer] VL med [employer]    PT Start Time 0935    PT Stop Time 9562    PT Time Calculation (min) 40 min             Past Medical History:  Diagnosis Date   Cancer ([place]) 12/2018   left breast cancer   Cerebrovascular disease    Complication of anesthesia    with hysterectomy in 1980   Family history of breast cancer    Family history of ovarian cancer    GERD (gastroesophageal reflux disease)    Hypertension    Hypothyroidism    Obesity    Palpitations    PONV (postoperative nausea and vomiting)    Past Surgical History:  Procedure Laterality Date   ADENOIDECTOMY     BLADDER SURGERY     BREAST LUMPECTOMY WITH RADIOACTIVE SEED LOCALIZATION Left 02/14/2019   Procedure: LEFT BREAST LUMPECTOMY WITH RADIOACTIVE SEED LOCALIZATION;  Surgeon: Alphonsa Overall, MD;  Location: [facility];  Service: General;  Laterality: Left;   [place]     Patient Active Problem List   Diagnosis Date Noted   Atrophy of deltoid muscle 11/23/2021   Genetic testing 07/23/2019   Attenuated familial adenomatous polyposis 07/23/2019   Family history of breast cancer    Family history of ovarian cancer    Lobular carcinoma in situ (LCIS) of left breast 03/20/2019   Hypertension 08/17/2011   Cerebrovascular disease 08/17/2011   Hyperlipidemia 08/17/2011   DIZZINESS 01/18/2010       REFERRING DIAG: M25.511 (ICD-10-CM) - Pain in joint of right shoulder   THERAPY DIAG:  Atrophy of deltoid muscle   Muscle weakness (generalized)   Acute pain of right shoulder    Rationale for Evaluation and Treatment: Rehabilitation   ONSET DATE: May 2023   SUBJECTIVE:                                                                                                                                                                                                          SUBJECTIVE STATEMENT:  04/24/2022 No pain just soreness for a couple days.   Eval: States she has occasional throbbing in her right shoulder, but otherwise no pain. States that she noticed an indent in her right arm May more or less the same since then. Difficulties with overhead activities like washing hair. States that she got a stationary bike and was pulling on the moveable handles and didn't know it that contributed and she has had it for 2 years. Wants to get back to the [facility]   Fatigues easily when performing ADLs.  She notes pain in the right lateral upper arm and as above fatigue with upper arm motion.  Pain is worse with overhead motion reaching back at at bedtime.  She denies pain radiating down arm below the elbow.   PERTINENT HISTORY:  L breast cancer, R RCR 2020, benign tremors with activities   PAIN:  No pain.   PRECAUTIONS: None   WEIGHT BEARING RESTRICTIONS: No   FALLS:  Has patient fallen in last 6 months? No   LIVING ENVIRONMENT: Lives with: lives alone Lives in: House/apartment   OCCUPATION: retired    PLOF: Independent, enjoys -walking and reading    PATIENT GOALS: improve strength in right arm and prevent it from happening again   OBJECTIVE:    DIAGNOSTIC FINDINGS:  04/04/22 MRI Neck IMPRESSION: 1. At C3-4 there is a broad-based disc bulge. Bilateral uncovertebral degenerative changes. Moderate right and mild left foraminal stenosis. 2. At C4-5 there is a broad-based disc osteophyte complex. Bilateral uncovertebral degenerative changes. Moderate bilateral foraminal stenosis. 3. At C5-6 there is a broad-based disc osteophyte complex. Bilateral uncovertebral  degenerative changes. Moderate bilateral foraminal stenosis. 4. No acute osseous injury of the cervical spine.   R Shoulder IMPRESSION: 1. Small partial-thickness midsubstance tear of the slightly anterior aspect of the supraspinatus tendon footprint insertion, measuring up to 8 mm in AP dimension. No tendon retraction. This does not extend through an articular or bursal surface of the tendon and may not be visualized at arthroscopy. 2. Moderate to high-grade degenerative changes of the acromioclavicular joint. 3. Type II acromion with mild distal lateral subacromial spurring and mild narrowing of the distal lateral subacromial space. 4. Trace fluid within the subacromial/subdeltoid bursa.       COGNITION: Overall cognitive status: Within functional limits for tasks assessed       POSTURE: rounded shoulders, forward head, and flexed trunk    PALPATION: Tenderness to palpation along right levator           CERVICAL ROM:    Active ROM A/PROM (deg) eval  Flexion WNL  Extension 75% limited  Right lateral flexion    Left lateral flexion    Right rotation 50% limited  Left rotation 50% limited   (Blank rows = not tested)                UE Measurements       Upper Extremity Right 04/13/2022 Left 04/13/2022    A/PROM MMT A/PROM MMT  Shoulder Flexion 135 3+ 140 3+  Shoulder Extension          Shoulder Abduction 120 3+ 130 3+  Shoulder Adduction          Shoulder Internal Rotation Reaches to T12 SP 3+ Reaches to T12 SP 3+  Shoulder External Rotation T2SP  3+ T2 SP  3+  Elbow Flexion          Elbow Extension          Wrist Flexion  Wrist Extension          Wrist Supination          Wrist Pronation          Wrist Ulnar Deviation          Wrist Radial Deviation          Grip Strength NA   NA                          (Blank rows = not tested)                       * pain Right handed    CERVICAL SPECIAL TESTS:  Spurling's test: Positive     TODAY'S  TREATMENT:                                                                                                                              DATE:04/24/2022  Therapeutic Exercise:           Seated:  pulleys 2 minutes flexion, 2 minutes abd           Standing: shoulder extension with GTB 3x10 B, shoulder flexion with medial anchor pull 3x10 B RTB, 3x10 1# weight shoulder flexion, shoulder abd  4 x5 1 # weights B, shoulder flexion up wall 2 minutes with ball, lateral wall rolls 2 minutes     Neuromuscular Re-education: Manual Therapy: Therapeutic Activity: Self Care: Trigger Point Dry Needling:  Modalities:      PATIENT EDUCATION:  Education details: on HEP Person educated: Patient Education method: Consulting civil engineer, Media planner, and Handouts Education comprehension: verbalized understanding   HOME EXERCISE PROGRAM: [id number]   ASSESSMENT:   CLINICAL IMPRESSION: 04/24/2022 Contimed with strengthening exercises, this was tolerated well. Fatigue in arms noted but no pain. Reducing patient to 1x/week secondary to progress made and independence in HEP. Will continue with current POC as tolerated.   Eval: Patient presents with right shoulder weakness and reports of indentation that occurred in May earlier this year right above her deltoid. No significant findings with imaging. Patient education in findings and POC. Reviewed previous home exercises and cued for proper form ans she has a tendency to abduct her arms with all shoulder exercises right more than left. Patient would greatly benefit from skilled PT to improve overall function and QOL.     OBJECTIVE IMPAIRMENTS: decreased activity tolerance, decreased ROM, decreased strength, postural dysfunction, and pain.    ACTIVITY LIMITATIONS: carrying, lifting, bathing, and reach over head   PARTICIPATION LIMITATIONS: meal prep and cleaning   PERSONAL FACTORS: Age and Time since onset of injury/illness/exacerbation are also affecting patient's  functional outcome.    REHAB POTENTIAL: Good   CLINICAL DECISION MAKING: Stable/uncomplicated   EVALUATION COMPLEXITY: Low     GOALS: Goals reviewed with patient? yes   SHORT TERM GOALS: Target date: 05/04/2022  Patient will be independent in self management  strategies to improve quality of life and functional outcomes. Baseline: New Program Goal status: INITIAL   2.  Patient will report at least 50% improvement in overall symptoms and/or function to demonstrate improved functional mobility Baseline: 0% better Goal status: INITIAL   3.  Patient will be able to demonstrate shoulder flexion without lumbar pain or compensations Baseline:  Goal status: INITIAL           LONG TERM GOALS: Target date: 05/25/2022    Patient will report at least 75% improvement in overall symptoms and/or function to demonstrate improved functional mobility Baseline: 0% better Goal status: INITIAL   2.  Patient will be able to wash and style hair without fatigue to improve overhead endurance Baseline:  Goal status: INITIAL   3.  Patient will be able to report returning safely to gym with no pain or difficulties to return to PLOF Baseline:  Goal status: INITIAL   4.  Patient will be able to demonstrate at least 4- MMT in B UE Baseline:  Goal status: INITIAL     PLAN:   PT FREQUENCY: 2x/week   PT DURATION: 6 weeks   PLANNED INTERVENTIONS: Therapeutic exercises, Therapeutic activity, Neuromuscular re-education, Balance training, Gait training, Patient/Family education, Self Care, Joint mobilization, Aquatic Therapy, Dry Needling, Electrical stimulation, Cryotherapy, Moist heat, Vasopneumatic device, Traction, Ultrasound, Ionotophoresis '4mg'$ /ml Dexamethasone, Manual therapy, and Re-evaluation   PLAN FOR NEXT SESSION: shoulder ROM and strengthening, posture/thoracic mobility, possibly reduce to 1x/week     10:16 AM, 04/24/22 Jerene Pitch, DPT Physical Therapy with [facility]

## 2022-04-26 ENCOUNTER — Encounter: Payer: Self-pay | Admitting: Physical Therapy

## 2022-04-26 ENCOUNTER — Ambulatory Visit: Payer: Medicare Other | Admitting: Physical Therapy

## 2022-04-26 DIAGNOSIS — M62519 Muscle wasting and atrophy, not elsewhere classified, unspecified shoulder: Secondary | ICD-10-CM | POA: Diagnosis not present

## 2022-04-26 DIAGNOSIS — M6281 Muscle weakness (generalized): Secondary | ICD-10-CM | POA: Diagnosis not present

## 2022-04-26 DIAGNOSIS — M25511 Pain in right shoulder: Secondary | ICD-10-CM

## 2022-04-26 NOTE — Therapy (Signed)
OUTPATIENT PHYSICAL THERAPY TREATMENT NOTE   Patient Name: Faith Mcmahon MRN: [id number] DOB:November 11, 1940, 81 y.o., female Today's Date: 04/26/2022  PCP: Deland Pretty, MD   REFERRING PROVIDER: Gregor Hams, MD  END OF SESSION:   PT End of Session - 04/26/22 1259     Visit Number 5    Number of Visits 12    Date for PT Re-Evaluation 05/25/22    Authorization Type [employer] VL med Delma [employer]    PT Start Time 1300    PT Stop Time 6378    PT Time Calculation (min) 39 min             Past Medical History:  Diagnosis Date   Cancer ([place]) 12/2018   left breast cancer   Cerebrovascular disease    Complication of anesthesia    with hysterectomy in 1980   Family history of breast cancer    Family history of ovarian cancer    GERD (gastroesophageal reflux disease)    Hypertension    Hypothyroidism    Obesity    Palpitations    PONV (postoperative nausea and vomiting)    Past Surgical History:  Procedure Laterality Date   ADENOIDECTOMY     BLADDER SURGERY     BREAST LUMPECTOMY WITH RADIOACTIVE SEED LOCALIZATION Left 02/14/2019   Procedure: LEFT BREAST LUMPECTOMY WITH RADIOACTIVE SEED LOCALIZATION;  Surgeon: Alphonsa Overall, MD;  Location: [place];  Service: General;  Laterality: Left;   [place]     Patient Active Problem List   Diagnosis Date Noted   Atrophy of deltoid muscle 11/23/2021   Genetic testing 07/23/2019   Attenuated familial adenomatous polyposis 07/23/2019   Family history of breast cancer    Family history of ovarian cancer    Lobular carcinoma in situ (LCIS) of left breast 03/20/2019   Hypertension 08/17/2011   Cerebrovascular disease 08/17/2011   Hyperlipidemia 08/17/2011   DIZZINESS 01/18/2010       REFERRING DIAG: M25.511 (ICD-10-CM) - Pain in joint of right shoulder   THERAPY DIAG:  Atrophy of deltoid muscle   Muscle weakness (generalized)   Acute pain of right shoulder    Rationale for Evaluation and Treatment: Rehabilitation   ONSET DATE: May 2023   SUBJECTIVE:                                                                                                                                                                                                          SUBJECTIVE STATEMENT:  04/26/2022 States she has no difficulties with her exercises and not pain. States that she feels stronger and feels at least 60% better since the start of PT.   Eval: States she has occasional throbbing in her right shoulder, but otherwise no pain. States that she noticed an indent in her right arm May more or less the same since then. Difficulties with overhead activities like washing hair. States that she got a stationary bike and was pulling on the moveable handles and didn't know it that contributed and she has had it for 2 years. Wants to get back to the [facility]   Fatigues easily when performing ADLs.  She notes pain in the right lateral upper arm and as above fatigue with upper arm motion.  Pain is worse with overhead motion reaching back at at bedtime.  She denies pain radiating down arm below the elbow.   PERTINENT HISTORY:  L breast cancer, R RCR 2020, benign tremors with activities   PAIN:  No pain.   PRECAUTIONS: None   WEIGHT BEARING RESTRICTIONS: No   FALLS:  Has patient fallen in last 6 months? No   LIVING ENVIRONMENT: Lives with: lives alone Lives in: House/apartment   OCCUPATION: retired    PLOF: Independent, enjoys -walking and reading    PATIENT GOALS: improve strength in right arm and prevent it from happening again   OBJECTIVE:    DIAGNOSTIC FINDINGS:  04/04/22 MRI Neck IMPRESSION: 1. At C3-4 there is a broad-based disc bulge. Bilateral uncovertebral degenerative changes. Moderate right and mild left foraminal stenosis. 2. At C4-5 there is a broad-based disc osteophyte complex. Bilateral uncovertebral degenerative changes. Moderate bilateral  foraminal stenosis. 3. At C5-6 there is a broad-based disc osteophyte complex. Bilateral uncovertebral degenerative changes. Moderate bilateral foraminal stenosis. 4. No acute osseous injury of the cervical spine.   R Shoulder IMPRESSION: 1. Small partial-thickness midsubstance tear of the slightly anterior aspect of the supraspinatus tendon footprint insertion, measuring up to 8 mm in AP dimension. No tendon retraction. This does not extend through an articular or bursal surface of the tendon and may not be visualized at arthroscopy. 2. Moderate to high-grade degenerative changes of the acromioclavicular joint. 3. Type II acromion with mild distal lateral subacromial spurring and mild narrowing of the distal lateral subacromial space. 4. Trace fluid within the subacromial/subdeltoid bursa.       COGNITION: Overall cognitive status: Within functional limits for tasks assessed       POSTURE: rounded shoulders, forward head, and flexed trunk    PALPATION: Tenderness to palpation along right levator           CERVICAL ROM:    Active ROM A/PROM (deg) 04/26/22  Flexion WNL  Extension 75% limited  Right lateral flexion    Left lateral flexion    Right rotation 50% limited  Left rotation 50% limited   (Blank rows = not tested)                UE Measurements       Upper Extremity Right 04/26/2022 Left 04/26/2022    A/PROM MMT A/PROM MMT  Shoulder Flexion 150 4- 150 4-  Shoulder Extension          Shoulder Abduction WFL 3+ WFL 3+  Shoulder Adduction          Shoulder Internal Rotation Reaches to T12 SP 3+ Reaches to T12 SP 3+  Shoulder External Rotation T2SP  4- T2 SP  4-  Elbow Flexion  Elbow Extension          Wrist Flexion          Wrist Extension          Wrist Supination          Wrist Pronation          Wrist Ulnar Deviation          Wrist Radial Deviation          Grip Strength NA   NA                          (Blank rows = not tested)                        * pain Right handed    CERVICAL SPECIAL TESTS:  Spurling's test: Positive     TODAY'S TREATMENT:                                                                                                                              DATE:04/26/2022  Therapeutic Exercise:           Seated:   reviewed all interventions on HEP           Standing: shoulder flexion at wall x15 B  Supine: bench press 2x15 5" holds, pec stretch x15 5" holds B, pec stretch 2x15 5" holds in each position  Neuromuscular Re-education: Manual Therapy: Therapeutic Activity: Self Care: Trigger Point Dry Needling:  Modalities:      PATIENT EDUCATION:  Education details: on HEP, on POC and progress made, on safe return to gym Person educated: Patient Education method: Explanation, Demonstration, and Handouts Education comprehension: verbalized understanding   HOME EXERCISE PROGRAM: [id number]   ASSESSMENT:   CLINICAL IMPRESSION: 04/26/2022 Focused on review of HEP and answering all questions. Discussed transition to reduced frequency in clinic and gradually returning to gym for lower body exercises. Fatigue in arms noted end of session but no pain. Added new exercises to HEP. Will continue with current POC as tolerated.   Eval: Patient presents with right shoulder weakness and reports of indentation that occurred in May earlier this year right above her deltoid. No significant findings with imaging. Patient education in findings and POC. Reviewed previous home exercises and cued for proper form ans she has a tendency to abduct her arms with all shoulder exercises right more than left. Patient would greatly benefit from skilled PT to improve overall function and QOL.     OBJECTIVE IMPAIRMENTS: decreased activity tolerance, decreased ROM, decreased strength, postural dysfunction, and pain.    ACTIVITY LIMITATIONS: carrying, lifting, bathing, and reach over head   PARTICIPATION LIMITATIONS: meal prep and  cleaning   PERSONAL FACTORS: Age and Time since onset of injury/illness/exacerbation are also affecting patient's functional outcome.    REHAB POTENTIAL: Good   CLINICAL DECISION MAKING: Stable/uncomplicated   EVALUATION COMPLEXITY: Low       GOALS: Goals reviewed with patient? yes   SHORT TERM GOALS: Target date: 05/04/2022  Patient will be independent in self management strategies to improve quality of life and functional outcomes. Baseline: New Program Goal status: MET   2.  Patient will report at least 50% improvement in overall symptoms and/or function to demonstrate improved functional mobility Baseline: 0% better Goal status: MET   3.  Patient will be able to demonstrate shoulder flexion without lumbar pain or compensations Baseline:  Goal status: PARTIALLY MET           LONG TERM GOALS: Target date: 05/25/2022    Patient will report at least 75% improvement in overall symptoms and/or function to demonstrate improved functional mobility Baseline: 0% better Goal status: PROGRESSING   2.  Patient will be able to wash and style hair without fatigue to improve overhead endurance Baseline:  Goal status: MET   3.  Patient will be able to report returning safely to gym with no pain or difficulties to return to PLOF Baseline:  Goal status: PROGRESSING   4.  Patient will be able to demonstrate at least 4- MMT in B UE Baseline:  Goal status: PROGRESSING     PLAN:   PT FREQUENCY: 2x/week   PT DURATION: 6 weeks   PLANNED INTERVENTIONS: Therapeutic exercises, Therapeutic activity, Neuromuscular re-education, Balance training, Gait training, Patient/Family education, Self Care, Joint mobilization, Aquatic Therapy, Dry Needling, Electrical stimulation, Cryotherapy, Moist heat, Vasopneumatic device, Traction, Ultrasound, Ionotophoresis 27m/ml Dexamethasone, Manual therapy, and Re-evaluation   PLAN FOR NEXT SESSION: shoulder ROM and strengthening, posture/thoracic  mobility, f/u with return to gym for LE and tolerance to interventions with machines     1:42 PM, 04/26/22 MJerene Pitch DPT [facility]

## 2022-05-01 ENCOUNTER — Encounter: Payer: Medicare Other | Admitting: Physical Therapy

## 2022-05-01 ENCOUNTER — Ambulatory Visit: Payer: Medicare Other | Admitting: Physical Therapy

## 2022-05-01 ENCOUNTER — Encounter: Payer: Self-pay | Admitting: Physical Therapy

## 2022-05-01 DIAGNOSIS — M62519 Muscle wasting and atrophy, not elsewhere classified, unspecified shoulder: Secondary | ICD-10-CM | POA: Diagnosis not present

## 2022-05-01 DIAGNOSIS — M25511 Pain in right shoulder: Secondary | ICD-10-CM | POA: Diagnosis not present

## 2022-05-01 DIAGNOSIS — M6281 Muscle weakness (generalized): Secondary | ICD-10-CM | POA: Diagnosis not present

## 2022-05-01 NOTE — Therapy (Signed)
OUTPATIENT PHYSICAL THERAPY TREATMENT NOTE   Patient Name: Faith Mcmahon MRN: [id number] DOB:10/10/1940, 81 y.o., female Today's Date: 05/01/2022  PCP: Deland Pretty, MD   REFERRING PROVIDER: Gregor Hams, MD  END OF SESSION:   PT End of Session - 05/01/22 0931     Visit Number 6    Number of Visits 12    Date for PT Re-Evaluation 05/25/22    Authorization Type [employer] VL med [place]    PT Start Time 4098    PT Stop Time 1011    PT Time Calculation (min) 38 min             Past Medical History:  Diagnosis Date   Cancer ([place]) 12/2018   left breast cancer   Cerebrovascular disease    Complication of anesthesia    with hysterectomy in 1980   Family history of breast cancer    Family history of ovarian cancer    GERD (gastroesophageal reflux disease)    Hypertension    Hypothyroidism    Obesity    Palpitations    PONV (postoperative nausea and vomiting)    Past Surgical History:  Procedure Laterality Date   ADENOIDECTOMY     BLADDER SURGERY     BREAST LUMPECTOMY WITH RADIOACTIVE SEED LOCALIZATION Left 02/14/2019   Procedure: LEFT BREAST LUMPECTOMY WITH RADIOACTIVE SEED LOCALIZATION;  Surgeon: Alphonsa Overall, MD;  Location: [place];  Service: General;  Laterality: Left;   [place]     Patient Active Problem List   Diagnosis Date Noted   Atrophy of deltoid muscle 11/23/2021   Genetic testing 07/23/2019   Attenuated familial adenomatous polyposis 07/23/2019   Family history of breast cancer    Family history of ovarian cancer    Lobular carcinoma in situ (LCIS) of left breast 03/20/2019   Hypertension 08/17/2011   Cerebrovascular disease 08/17/2011   Hyperlipidemia 08/17/2011   DIZZINESS 01/18/2010       REFERRING DIAG: M25.511 (ICD-10-CM) - Pain in joint of right shoulder   THERAPY DIAG:  Atrophy of deltoid muscle   Muscle weakness (generalized)   Acute pain of right shoulder    Rationale for Evaluation and Treatment: Rehabilitation   ONSET DATE: May 2023   SUBJECTIVE:                                                                                                                                                                                                          SUBJECTIVE STATEMENT:  05/01/2022 States that she got to gym and she had difficulties with how to work different machines. States she forget her exercise paper for her previous workout at the gym  Eval: States she has occasional throbbing in her right shoulder, but otherwise no pain. States that she noticed an indent in her right arm May more or less the same since then. Difficulties with overhead activities like washing hair. States that she got a stationary bike and was pulling on the moveable handles and didn't know it that contributed and she has had it for 2 years. Wants to get back to the [facility]   Fatigues easily when performing ADLs.  She notes pain in the right lateral upper arm and as above fatigue with upper arm motion.  Pain is worse with overhead motion reaching back at at bedtime.  She denies pain radiating down arm below the elbow.   PERTINENT HISTORY:  L breast cancer, R RCR 2020, benign tremors with activities   PAIN:  No pain.   PRECAUTIONS: None   WEIGHT BEARING RESTRICTIONS: No   FALLS:  Has patient fallen in last 6 months? No   LIVING ENVIRONMENT: Lives with: lives alone Lives in: House/apartment   OCCUPATION: retired    PLOF: Independent, enjoys -walking and reading    PATIENT GOALS: improve strength in right arm and prevent it from happening again   OBJECTIVE:    DIAGNOSTIC FINDINGS:  04/04/22 MRI Neck IMPRESSION: 1. At C3-4 there is a broad-based disc bulge. Bilateral uncovertebral degenerative changes. Moderate right and mild left foraminal stenosis. 2. At C4-5 there is a broad-based disc osteophyte complex. Bilateral uncovertebral degenerative changes.  Moderate bilateral foraminal stenosis. 3. At C5-6 there is a broad-based disc osteophyte complex. Bilateral uncovertebral degenerative changes. Moderate bilateral foraminal stenosis. 4. No acute osseous injury of the cervical spine.   R Shoulder IMPRESSION: 1. Small partial-thickness midsubstance tear of the slightly anterior aspect of the supraspinatus tendon footprint insertion, measuring up to 8 mm in AP dimension. No tendon retraction. This does not extend through an articular or bursal surface of the tendon and may not be visualized at arthroscopy. 2. Moderate to high-grade degenerative changes of the acromioclavicular joint. 3. Type II acromion with mild distal lateral subacromial spurring and mild narrowing of the distal lateral subacromial space. 4. Trace fluid within the subacromial/subdeltoid bursa.       COGNITION: Overall cognitive status: Within functional limits for tasks assessed       POSTURE: rounded shoulders, forward head, and flexed trunk    PALPATION: Tenderness to palpation along right levator           CERVICAL ROM:    Active ROM A/PROM (deg) 04/26/22  Flexion WNL  Extension 75% limited  Right lateral flexion    Left lateral flexion    Right rotation 50% limited  Left rotation 50% limited   (Blank rows = not tested)                UE Measurements       Upper Extremity Right 04/26/2022 Left 04/26/2022    A/PROM MMT A/PROM MMT  Shoulder Flexion 150 4- 150 4-  Shoulder Extension          Shoulder Abduction WFL 3+ WFL 3+  Shoulder Adduction          Shoulder Internal Rotation Reaches to T12 SP 3+ Reaches to T12 SP 3+  Shoulder External Rotation T2SP  4- T2 SP  4-  Elbow Flexion  Elbow Extension          Wrist Flexion          Wrist Extension          Wrist Supination          Wrist Pronation          Wrist Ulnar Deviation          Wrist Radial Deviation          Grip Strength NA   NA                          (Blank rows =  not tested)                       * pain Right handed    CERVICAL SPECIAL TESTS:  Spurling's test: Positive     TODAY'S TREATMENT:                                                                                                                              DATE:05/01/2022  Therapeutic Exercise:           Seated:  bicep curls 2x12 B 2#, hammer curls 2x12 2# B, tricep extension 2# 3x12 B reviewed all interventions on HEP           Standing: shoulder flexion at wall x15 B, curl and OH press 3x12 B, upper body shoulder abd full ROM 3x12 B, shoulder ER with RTB 3x10 B , shoulder flexion with ER pull red band RTB     Neuromuscular Re-education: Manual Therapy: Therapeutic Activity: Self Care: Trigger Point Dry Needling:  Modalities:      PATIENT EDUCATION:  Education details: on HEP, on different gym exercises Person educated: Patient Education method: Explanation, Demonstration, and Handouts Education comprehension: verbalized understanding   HOME EXERCISE PROGRAM: [id number]   ASSESSMENT:   CLINICAL IMPRESSION: 05/01/2022 Continued to focus on exercises and progressed UE strengthening exercises as tolerated. Cued for posture, form and rest breaks throughout session. No pain just fatigued in UE. Will continue with current POC as tolerated.   Eval: Patient presents with right shoulder weakness and reports of indentation that occurred in May earlier this year right above her deltoid. No significant findings with imaging. Patient education in findings and POC. Reviewed previous home exercises and cued for proper form ans she has a tendency to abduct her arms with all shoulder exercises right more than left. Patient would greatly benefit from skilled PT to improve overall function and QOL.     OBJECTIVE IMPAIRMENTS: decreased activity tolerance, decreased ROM, decreased strength, postural dysfunction, and pain.    ACTIVITY LIMITATIONS: carrying, lifting, bathing, and reach over head    PARTICIPATION LIMITATIONS: meal prep and cleaning   PERSONAL FACTORS: Age and Time since onset of injury/illness/exacerbation are also affecting patient's functional outcome.    REHAB POTENTIAL: Good   CLINICAL DECISION MAKING:  Stable/uncomplicated   EVALUATION COMPLEXITY: Low     GOALS: Goals reviewed with patient? yes   SHORT TERM GOALS: Target date: 05/04/2022  Patient will be independent in self management strategies to improve quality of life and functional outcomes. Baseline: New Program Goal status: MET   2.  Patient will report at least 50% improvement in overall symptoms and/or function to demonstrate improved functional mobility Baseline: 0% better Goal status: MET   3.  Patient will be able to demonstrate shoulder flexion without lumbar pain or compensations Baseline:  Goal status: PARTIALLY MET           LONG TERM GOALS: Target date: 05/25/2022    Patient will report at least 75% improvement in overall symptoms and/or function to demonstrate improved functional mobility Baseline: 0% better Goal status: PROGRESSING   2.  Patient will be able to wash and style hair without fatigue to improve overhead endurance Baseline:  Goal status: MET   3.  Patient will be able to report returning safely to gym with no pain or difficulties to return to PLOF Baseline:  Goal status: PROGRESSING   4.  Patient will be able to demonstrate at least 4- MMT in B UE Baseline:  Goal status: PROGRESSING     PLAN:   PT FREQUENCY: 2x/week   PT DURATION: 6 weeks   PLANNED INTERVENTIONS: Therapeutic exercises, Therapeutic activity, Neuromuscular re-education, Balance training, Gait training, Patient/Family education, Self Care, Joint mobilization, Aquatic Therapy, Dry Needling, Electrical stimulation, Cryotherapy, Moist heat, Vasopneumatic device, Traction, Ultrasound, Ionotophoresis 73m/ml Dexamethasone, Manual therapy, and Re-evaluation   PLAN FOR NEXT SESSION: shoulder ROM  and strengthening, posture/thoracic mobility, f/u with return to gym for LE and tolerance to interventions with machines     10:15 AM, 05/01/22 MJerene Pitch DPT Physical Therapy with [facility]

## 2022-05-03 ENCOUNTER — Encounter: Payer: Medicare Other | Admitting: Physical Therapy

## 2022-05-08 ENCOUNTER — Encounter: Payer: Self-pay | Admitting: Physical Therapy

## 2022-05-08 ENCOUNTER — Ambulatory Visit: Payer: Medicare Other | Admitting: Physical Therapy

## 2022-05-08 DIAGNOSIS — M6281 Muscle weakness (generalized): Secondary | ICD-10-CM | POA: Diagnosis not present

## 2022-05-08 DIAGNOSIS — M25511 Pain in right shoulder: Secondary | ICD-10-CM

## 2022-05-08 DIAGNOSIS — M62519 Muscle wasting and atrophy, not elsewhere classified, unspecified shoulder: Secondary | ICD-10-CM | POA: Diagnosis not present

## 2022-05-08 NOTE — Therapy (Signed)
OUTPATIENT PHYSICAL THERAPY TREATMENT NOTE   Patient Name: Faith Mcmahon MRN: [id number] DOB:08/18/1940, 81 y.o., female Today's Date: 05/08/2022  PCP: Pharr, Walter, MD   REFERRING PROVIDER: Corey, Evan S, MD  END OF SESSION:   PT End of Session - 05/08/22 0930     Visit Number 7    Number of Visits 12    Date for PT Re-Evaluation 05/25/22    Authorization Type [employer] VL med [place]    PT Start Time 0932    PT Stop Time 1010    PT Time Calculation (min) 38 min             Past Medical History:  Diagnosis Date   Cancer ([facility]) 12/2018   left breast cancer   Cerebrovascular disease    Complication of anesthesia    with hysterectomy in 1980   Family history of breast cancer    Family history of ovarian cancer    GERD (gastroesophageal reflux disease)    Hypertension    Hypothyroidism    Obesity    Palpitations    PONV (postoperative nausea and vomiting)    Past Surgical History:  Procedure Laterality Date   ADENOIDECTOMY     BLADDER SURGERY     BREAST LUMPECTOMY WITH RADIOACTIVE SEED LOCALIZATION Left 02/14/2019   Procedure: LEFT BREAST LUMPECTOMY WITH RADIOACTIVE SEED LOCALIZATION;  Surgeon: Newman, David, MD;  Location: [facility];  Service: General;  Laterality: Left;   CHOLECYSTECTOMY     ROTATOR CUFF REPAIR     TONSILLECTOMY     Patient Active Problem List   Diagnosis Date Noted   Atrophy of deltoid muscle 11/23/2021   Genetic testing 07/23/2019   Attenuated familial adenomatous polyposis 07/23/2019   Family history of breast cancer    Family history of ovarian cancer    Lobular carcinoma in situ (LCIS) of left breast 03/20/2019   Hypertension 08/17/2011   Cerebrovascular disease 08/17/2011   Hyperlipidemia 08/17/2011   DIZZINESS 01/18/2010       REFERRING DIAG: M25.511 (ICD-10-CM) - Pain in joint of right shoulder   THERAPY DIAG:  Atrophy of deltoid muscle   Muscle weakness (generalized)   Acute pain of right shoulder    Rationale for Evaluation and Treatment: Rehabilitation   ONSET DATE: May 2023   SUBJECTIVE:                                                                                                                                                                                                          SUBJECTIVE STATEMENT:   05/08/2022 States she had difficulty griping her curling iron over the weekend as it felt like her hand was numb. Better today.   Eval: States she has occasional throbbing in her right shoulder, but otherwise no pain. States that she noticed an indent in her right arm May more or less the same since then. Difficulties with overhead activities like washing hair. States that she got a stationary bike and was pulling on the moveable handles and didn't know it that contributed and she has had it for 2 years. Wants to get back to the [employer]   Fatigues easily when performing ADLs.  She notes pain in the right lateral upper arm and as above fatigue with upper arm motion.  Pain is worse with overhead motion reaching back at at bedtime.  She denies pain radiating down arm below the elbow.   PERTINENT HISTORY:  L breast cancer, R RCR 2020, benign tremors with activities   PAIN:  No pain.   PRECAUTIONS: None   WEIGHT BEARING RESTRICTIONS: No   FALLS:  Has patient fallen in last 6 months? No   LIVING ENVIRONMENT: Lives with: lives alone Lives in: House/apartment   OCCUPATION: retired    PLOF: Independent, enjoys -walking and reading    PATIENT GOALS: improve strength in right arm and prevent it from happening again   OBJECTIVE:    DIAGNOSTIC FINDINGS:  04/04/22 MRI Neck IMPRESSION: 1. At C3-4 there is a broad-based disc bulge. Bilateral uncovertebral degenerative changes. Moderate right and mild left foraminal stenosis. 2. At C4-5 there is a broad-based disc osteophyte complex. Bilateral uncovertebral degenerative changes. Moderate bilateral foraminal stenosis. 3. At  C5-6 there is a broad-based disc osteophyte complex. Bilateral uncovertebral degenerative changes. Moderate bilateral foraminal stenosis. 4. No acute osseous injury of the cervical spine.   R Shoulder IMPRESSION: 1. Small partial-thickness midsubstance tear of the slightly anterior aspect of the supraspinatus tendon footprint insertion, measuring up to 8 mm in AP dimension. No tendon retraction. This does not extend through an articular or bursal surface of the tendon and may not be visualized at arthroscopy. 2. Moderate to high-grade degenerative changes of the acromioclavicular joint. 3. Type II acromion with mild distal lateral subacromial spurring and mild narrowing of the distal lateral subacromial space. 4. Trace fluid within the subacromial/subdeltoid bursa.       COGNITION: Overall cognitive status: Within functional limits for tasks assessed       POSTURE: rounded shoulders, forward head, and flexed trunk    PALPATION: Tenderness to palpation along right levator           CERVICAL ROM:    Active ROM A/PROM (deg) 04/26/22  Flexion WNL  Extension 75% limited  Right lateral flexion    Left lateral flexion    Right rotation 50% limited  Left rotation 50% limited   (Blank rows = not tested)                UE Measurements       Upper Extremity Right 04/26/2022 Left 04/26/2022    A/PROM MMT A/PROM MMT  Shoulder Flexion 150 4- 150 4-  Shoulder Extension          Shoulder Abduction WFL 3+ WFL 3+  Shoulder Adduction          Shoulder Internal Rotation Reaches to T12 SP 3+ Reaches to T12 SP 3+  Shoulder External Rotation T2SP  4- T2 SP  4-  Elbow Flexion            Elbow Extension          Wrist Flexion          Wrist Extension          Wrist Supination          Wrist Pronation          Wrist Ulnar Deviation          Wrist Radial Deviation          Grip Strength NA   NA                          (Blank rows = not tested)                       * pain Right  handed    CERVICAL SPECIAL TESTS:  Spurling's test: Positive     TODAY'S TREATMENT:                                                                                                                              DATE:05/08/2022  Therapeutic Exercise:           Seated:  finger/wrist/hand joint mobility PROM/AAROM/AROM - 15 minutes B, review of gym exercises, donning/doffing ankle weight on hand for home use with HEP            Standing:       Neuromuscular Re-education: Manual Therapy:percussion gun to B hands with thick towel 8 minutes total Therapeutic Activity: Self Care: Trigger Point Dry Needling:  Modalities:      PATIENT EDUCATION:  Education details: on HEP, on possible causes for blue fingertips, on benefits of vibration and mobility, on possible transition to ankle weights for UE exercises Person educated: Patient Education method: Explanation, Demonstration, and Handouts Education comprehension: verbalized understanding   HOME EXERCISE PROGRAM: [id number]   ASSESSMENT:   CLINICAL IMPRESSION: 05/08/2022 Focused on educating patient on current presentation and encouraging patient to follow up with health care providers about recent increase in discoloration of finger tips (right thumb tip blue purple on arrival).Trailed percussion gun to hand and perfusion in hands and hand temperature improved. Overall tolerated session well. Will continue with current POC as tolerated  Eval: Patient presents with right shoulder weakness and reports of indentation that occurred in May earlier this year right above her deltoid. No significant findings with imaging. Patient education in findings and POC. Reviewed previous home exercises and cued for proper form ans she has a tendency to abduct her arms with all shoulder exercises right more than left. Patient would greatly benefit from skilled PT to improve overall function and QOL.     OBJECTIVE IMPAIRMENTS: decreased activity tolerance,  decreased ROM, decreased strength, postural dysfunction, and pain.    ACTIVITY LIMITATIONS: carrying, lifting, bathing, and reach over head   PARTICIPATION LIMITATIONS: meal prep and cleaning   PERSONAL FACTORS: Age and Time since onset of injury/illness/exacerbation   are also affecting patient's functional outcome.    REHAB POTENTIAL: Good   CLINICAL DECISION MAKING: Stable/uncomplicated   EVALUATION COMPLEXITY: Low     GOALS: Goals reviewed with patient? yes   SHORT TERM GOALS: Target date: 05/04/2022  Patient will be independent in self management strategies to improve quality of life and functional outcomes. Baseline: New Program Goal status: MET   2.  Patient will report at least 50% improvement in overall symptoms and/or function to demonstrate improved functional mobility Baseline: 0% better Goal status: MET   3.  Patient will be able to demonstrate shoulder flexion without lumbar pain or compensations Baseline:  Goal status: PARTIALLY MET           LONG TERM GOALS: Target date: 05/25/2022    Patient will report at least 75% improvement in overall symptoms and/or function to demonstrate improved functional mobility Baseline: 0% better Goal status: PROGRESSING   2.  Patient will be able to wash and style hair without fatigue to improve overhead endurance Baseline:  Goal status: MET   3.  Patient will be able to report returning safely to gym with no pain or difficulties to return to PLOF Baseline:  Goal status: PROGRESSING   4.  Patient will be able to demonstrate at least 4- MMT in B UE Baseline:  Goal status: PROGRESSING     PLAN:   PT FREQUENCY: 2x/week   PT DURATION: 6 weeks   PLANNED INTERVENTIONS: Therapeutic exercises, Therapeutic activity, Neuromuscular re-education, Balance training, Gait training, Patient/Family education, Self Care, Joint mobilization, Aquatic Therapy, Dry Needling, Electrical stimulation, Cryotherapy, Moist heat,  Vasopneumatic device, Traction, Ultrasound, Ionotophoresis 4mg/ml Dexamethasone, Manual therapy, and Re-evaluation   PLAN FOR NEXT SESSION: shoulder ROM and strengthening, posture/thoracic mobility, f/u with return to gym for LE and tolerance to interventions with machines     10:31 AM, 05/08/22 Michele Landry, DPT Physical Therapy with [place]       

## 2022-05-10 ENCOUNTER — Encounter: Payer: Medicare Other | Admitting: Physical Therapy

## 2022-05-15 ENCOUNTER — Encounter: Payer: Self-pay | Admitting: Physical Therapy

## 2022-05-15 ENCOUNTER — Ambulatory Visit: Payer: Medicare Other | Admitting: Physical Therapy

## 2022-05-15 DIAGNOSIS — M62519 Muscle wasting and atrophy, not elsewhere classified, unspecified shoulder: Secondary | ICD-10-CM

## 2022-05-15 DIAGNOSIS — M6281 Muscle weakness (generalized): Secondary | ICD-10-CM | POA: Diagnosis not present

## 2022-05-15 DIAGNOSIS — M25511 Pain in right shoulder: Secondary | ICD-10-CM

## 2022-05-15 NOTE — Therapy (Signed)
OUTPATIENT PHYSICAL THERAPY TREATMENT NOTE PHYSICAL THERAPY DISCHARGE SUMMARY  Visits from Start of Care: 8  Current functional level related to goals / functional outcomes: All but one goal met   Remaining deficits: Hand pain   Education / Equipment: See below   Patient agrees to discharge. Patient goals were partially met. Patient is being discharged due to being pleased with the current functional level.   Patient Name: Faith Mcmahon MRN: [id number] DOB:1941-02-09, 81 y.o., female 87 Date: 05/15/2022  PCP: Deland Pretty, MD   REFERRING PROVIDER: Gregor Hams, MD  END OF SESSION:   PT End of Session - 05/15/22 1106     Visit Number 8    Number of Visits 12    Date for PT Re-Evaluation 05/25/22    Authorization Type [employer] VL med [place] [facility]    PT Start Time 1106    PT Stop Time 4199    PT Time Calculation (min) 38 min             Past Medical History:  Diagnosis Date   Cancer ([place]) 12/2018   left breast cancer   Cerebrovascular disease    Complication of anesthesia    with hysterectomy in 1980   Family history of breast cancer    Family history of ovarian cancer    GERD (gastroesophageal reflux disease)    Hypertension    Hypothyroidism    Obesity    Palpitations    PONV (postoperative nausea and vomiting)    Past Surgical History:  Procedure Laterality Date   ADENOIDECTOMY     BLADDER SURGERY     BREAST LUMPECTOMY WITH RADIOACTIVE SEED LOCALIZATION Left 02/14/2019   Procedure: LEFT BREAST LUMPECTOMY WITH RADIOACTIVE SEED LOCALIZATION;  Surgeon: Alphonsa Overall, MD;  Location: [place];  Service: General;  Laterality: Left;   [place]     Patient Active Problem List   Diagnosis Date Noted   Atrophy of deltoid muscle 11/23/2021   Genetic testing 07/23/2019   Attenuated familial adenomatous polyposis 07/23/2019   Family history of breast cancer    Family history of ovarian cancer     Lobular carcinoma in situ (LCIS) of left breast 03/20/2019   Hypertension 08/17/2011   Cerebrovascular disease 08/17/2011   Hyperlipidemia 08/17/2011   DIZZINESS 01/18/2010       REFERRING DIAG: M25.511 (ICD-10-CM) - Pain in joint of right shoulder   THERAPY DIAG:  Atrophy of deltoid muscle   Muscle weakness (generalized)   Acute pain of right shoulder   Rationale for Evaluation and Treatment: Rehabilitation   ONSET DATE: May 2023   SUBJECTIVE:  SUBJECTIVE STATEMENT: 05/15/2022 States she found some ankle weights they 1.5#s and that is easier to use then squeezing the weights, Starts that she is definitely at least 50% better overall and thinks the main thing is just the hands at this time  Eval: States she has occasional throbbing in her right shoulder, but otherwise no pain. States that she noticed an indent in her right arm May more or less the same since then. Difficulties with overhead activities like washing hair. States that she got a stationary bike and was pulling on the moveable handles and didn't know it that contributed and she has had it for 2 years. Wants to get back to the [facility]   Fatigues easily when performing ADLs.  She notes pain in the right lateral upper arm and as above fatigue with upper arm motion.  Pain is worse with overhead motion reaching back at at bedtime.  She denies pain radiating down arm below the elbow.   PERTINENT HISTORY:  L breast cancer, R RCR 2020, benign tremors with activities   PAIN:  No pain.   PRECAUTIONS: None   WEIGHT BEARING RESTRICTIONS: No   FALLS:  Has patient fallen in last 6 months? No   LIVING ENVIRONMENT: Lives with: lives alone Lives in: House/apartment   OCCUPATION: retired    PLOF: Independent, enjoys -walking and  reading    PATIENT GOALS: improve strength in right arm and prevent it from happening again   OBJECTIVE:      CERVICAL ROM:    Active ROM A/PROM (deg) 05/15/22  Flexion WNL  Extension 50% limited  Right lateral flexion    Left lateral flexion    Right rotation 50% limited  Left rotation 50% limited   (Blank rows = not tested)                UE Measurements       Upper Extremity Right 05/15/2022 Left 05/15/2022    A/PROM MMT A/PROM MMT  Shoulder Flexion 150 4+ 150 4  Shoulder Extension          Shoulder Abduction WFL 4- WFL 4-  Shoulder Adduction          Shoulder Internal Rotation Reaches to L1SP SP 4 Reaches to T12 SP 4  Shoulder External Rotation T2SP  4- T2 SP  4-  Elbow Flexion          Elbow Extension          Wrist Flexion          Wrist Extension          Wrist Supination          Wrist Pronation          Wrist Ulnar Deviation          Wrist Radial Deviation          Grip Strength NA   NA                          (Blank rows = not tested)                       * pain Right handed         TODAY'S TREATMENT:  DATE:05/15/2022  Therapeutic Exercise:           Seated: finger abd 2 minutes total B, wrist extension stretch x3 30" holds B, wrist flexion stretch x3 30" holds B           Neuromuscular Re-education: Manual Therapy  Therapeutic Activity: Self Care: Trigger Point Dry Needling:  Modalities:      PATIENT EDUCATION:  Education details: on HEP, on current presentation, on progress made and POC moving forward Person educated: Patient Education method: Consulting civil engineer, Demonstration, and Handouts Education comprehension: verbalized understanding   HOME EXERCISE PROGRAM: [id number]   ASSESSMENT:   CLINICAL IMPRESSION: 05/15/2022 All but one goal met at this time and limitations at this time are mainly due to hand  tightness/discomfort. Focused on reviewing HEP and adding additional wrist/hand exercises to HEP. Answered all questions and patient doing well, will DC to HEP at this time.   Eval: Patient presents with right shoulder weakness and reports of indentation that occurred in May earlier this year right above her deltoid. No significant findings with imaging. Patient education in findings and POC. Reviewed previous home exercises and cued for proper form ans she has a tendency to abduct her arms with all shoulder exercises right more than left. Patient would greatly benefit from skilled PT to improve overall function and QOL.     OBJECTIVE IMPAIRMENTS: decreased activity tolerance, decreased ROM, decreased strength, postural dysfunction, and pain.    ACTIVITY LIMITATIONS: carrying, lifting, bathing, and reach over head   PARTICIPATION LIMITATIONS: meal prep and cleaning   PERSONAL FACTORS: Age and Time since onset of injury/illness/exacerbation are also affecting patient's functional outcome.    REHAB POTENTIAL: Good   CLINICAL DECISION MAKING: Stable/uncomplicated   EVALUATION COMPLEXITY: Low     GOALS: Goals reviewed with patient? yes   SHORT TERM GOALS: Target date: 05/04/2022  Patient will be independent in self management strategies to improve quality of life and functional outcomes. Baseline: New Program Goal status: MET   2.  Patient will report at least 50% improvement in overall symptoms and/or function to demonstrate improved functional mobility Baseline: 0% better Goal status: MET   3.  Patient will be able to demonstrate shoulder flexion without lumbar pain or compensations Baseline:  Goal status: MET           LONG TERM GOALS: Target date: 05/25/2022    Patient will report at least 75% improvement in overall symptoms and/or function to demonstrate improved functional mobility Baseline: 0% better Goal status: PARTIALLY MET   2.  Patient will be able to wash and  style hair without fatigue to improve overhead endurance Baseline:  Goal status: MET   3.  Patient will be able to report returning safely to gym with no pain or difficulties to return to PLOF Baseline:  Goal status: MET   4.  Patient will be able to demonstrate at least 4- MMT in B UE Baseline:  Goal status: MET     PLAN:   PT FREQUENCY: 2x/week   PT DURATION: 6 weeks   PLANNED INTERVENTIONS: Therapeutic exercises, Therapeutic activity, Neuromuscular re-education, Balance training, Gait training, Patient/Family education, Self Care, Joint mobilization, Aquatic Therapy, Dry Needling, Electrical stimulation, Cryotherapy, Moist heat, Vasopneumatic device, Traction, Ultrasound, Ionotophoresis 63m/ml Dexamethasone, Manual therapy, and Re-evaluation   PLAN FOR NEXT SESSION: DC to HEP     11:46 AM, 05/15/22 MJerene Pitch DPT Physical Therapy with CRoyston Sinner

## 2022-05-22 NOTE — Progress Notes (Unsigned)
   I, Peterson Lombard, LAT, ATC acting as a scribe for [name], MD.  Faith Mcmahon is a 81 y.o. female who presents to [place] at [facility] today for f/u R shoulder pain w/ visible deformity R lateral upper arm . Pt was last seen by Dr. Georgina Snell on 04/12/22 and was referred to PT 8 visits, d/c on 12/4. Today, pt reports  Dx testing: 04/04/22 R shoulder & c-spine MRI 01/13/22 NCV study             11/23/21 R shoulder, chest, & c-spine XR  Pertinent review of systems: ***  Relevant historical information: ***   Exam:  There were no vitals taken for this visit. General: Well Developed, well nourished, and in no acute distress.   MSK: ***    Lab and Radiology Results No results found for this or any previous visit (from the past 72 hour(s)). No results found.     Assessment and Plan: 81 y.o. female with ***   PDMP not reviewed this encounter. No orders of the defined types were placed in this encounter.  No orders of the defined types were placed in this encounter.    Discussed warning signs or symptoms. Please see discharge instructions. Patient expresses understanding.   ***

## 2022-05-23 ENCOUNTER — Ambulatory Visit: Payer: Medicare Other | Admitting: Family Medicine

## 2022-05-23 VITALS — BP 150/84 | HR 57 | Ht 66.0 in | Wt 156.0 lb

## 2022-05-23 DIAGNOSIS — M25511 Pain in right shoulder: Secondary | ICD-10-CM

## 2022-05-23 NOTE — Patient Instructions (Signed)
Thank you for coming in today.   Recheck as needed.   I can re-authorize PT in the future if needed.

## 2022-05-26 DIAGNOSIS — Z961 Presence of intraocular lens: Secondary | ICD-10-CM | POA: Diagnosis not present

## 2022-05-26 DIAGNOSIS — H04123 Dry eye syndrome of bilateral lacrimal glands: Secondary | ICD-10-CM | POA: Diagnosis not present

## 2022-05-26 DIAGNOSIS — H26493 Other secondary cataract, bilateral: Secondary | ICD-10-CM | POA: Diagnosis not present

## 2022-06-09 ENCOUNTER — Telehealth: Payer: Self-pay | Admitting: Hematology and Oncology

## 2022-06-09 NOTE — Telephone Encounter (Signed)
Rescheduled appointment per provider PAL. Patient is aware of the changes made to her upcoming appointment. 

## 2022-06-26 ENCOUNTER — Ambulatory Visit: Payer: Medicare Other | Admitting: Hematology and Oncology

## 2022-07-03 ENCOUNTER — Other Ambulatory Visit: Payer: Self-pay

## 2022-07-03 ENCOUNTER — Inpatient Hospital Stay: Payer: Medicare Other | Attending: Hematology and Oncology | Admitting: Hematology and Oncology

## 2022-07-03 VITALS — BP 174/66 | HR 61 | Temp 97.4°F | Resp 18 | Ht 66.0 in | Wt 158.8 lb

## 2022-07-03 DIAGNOSIS — Z7982 Long term (current) use of aspirin: Secondary | ICD-10-CM | POA: Insufficient documentation

## 2022-07-03 DIAGNOSIS — Z79899 Other long term (current) drug therapy: Secondary | ICD-10-CM | POA: Diagnosis not present

## 2022-07-03 DIAGNOSIS — D0502 Lobular carcinoma in situ of left breast: Secondary | ICD-10-CM | POA: Insufficient documentation

## 2022-07-03 DIAGNOSIS — Z79810 Long term (current) use of selective estrogen receptor modulators (SERMs): Secondary | ICD-10-CM | POA: Insufficient documentation

## 2022-07-03 NOTE — Progress Notes (Signed)
Patient Care Team: Deland Pretty, MD as PCP - General (Internal Medicine) Lorelle Gibbs, MD as Consulting Physician (Radiology)  DIAGNOSIS:  Encounter Diagnosis  Name Primary?   Lobular carcinoma in situ (LCIS) of left breast Yes    SUMMARY OF ONCOLOGIC HISTORY: Oncology History  Lobular carcinoma in situ (LCIS) of left breast  11/18/2018 Initial Diagnosis   Biopsy on 11/18/18 showed LCIS with calcifications. Breast MRI on 01/14/19 showed a single area of non-mass enhancement in the later left breast.    02/14/2019 Surgery   Left lumpectomy [name]): LCIS, 0.7cm, with no evidence of invasive carcinoma.   03/20/2019 -  Anti-estrogen oral therapy   Tamoxifen '10mg'$  daily, stopped because of frequent UTIs     CHIEF COMPLIANT:  Follow-up of LCIS     INTERVAL HISTORY: Faith Mcmahon is a 82 year old with above-mentioned history of LCIS who underwent lumpectomy in 2020 and could not tolerate tamoxifen because of frequent UTIs.  She is here for annual follow-up. She states that she has been doing good. She denies any pain or discomfort in breast.   ALLERGIES:  is allergic to penicillins, ciprofloxacin, eletriptan, nortriptyline, nsaids, other, prolia [denosumab], raloxifene, sertraline, tetanus toxoids, and tioconazole.  MEDICATIONS:  Current Outpatient Medications  Medication Sig Dispense Refill   aspirin 81 MG tablet Take 81 mg by mouth daily.     bisoprolol-hydrochlorothiazide (ZIAC) 5-6.25 MG tablet Take 1 tablet by mouth daily. 90 tablet 2   calcium carbonate 200 MG capsule Take 250 mg by mouth 2 (two) times daily with a meal.     cholecalciferol (VITAMIN D) 1000 UNITS tablet Take 1,000 Units by mouth daily.     CRANBERRY FRUIT PO Take 1 tablet by mouth 2 (two) times daily.     levothyroxine (SYNTHROID, LEVOTHROID) 50 MCG tablet SAT- SUN     levothyroxine (SYNTHROID, LEVOTHROID) 75 MCG tablet MON- FRI     Methylcellulose, Laxative, (CITRUCEL PO) Take 2 tablets by mouth  daily.     Multiple Vitamin (MULTIVITAMIN) capsule Take 1 capsule by mouth daily.     omeprazole (PRILOSEC) 20 MG capsule Take 20 mg by mouth daily.     Probiotic Product (PROBIOTIC ADVANCED PO) Take 1 capsule by mouth 2 (two) times daily.     rosuvastatin (CRESTOR) 10 MG tablet Take 10 mg by mouth every other day.      vitamin B-12 (CYANOCOBALAMIN) 1000 MCG tablet Take 1,000 mcg by mouth daily.     No current facility-administered medications for this visit.    PHYSICAL EXAMINATION: ECOG PERFORMANCE STATUS: 1 - Symptomatic but completely ambulatory  Vitals:   07/03/22 1332  BP: (!) 174/66  Pulse: 61  Resp: 18  Temp: (!) 97.4 F (36.3 C)  SpO2: 99%   Filed Weights   07/03/22 1332  Weight: 158 lb 12.8 oz (72 kg)    BREAST: No palpable masses or nodules in either right or left breasts. No palpable axillary supraclavicular or infraclavicular adenopathy no breast tenderness or nipple discharge. (exam performed in the presence of a chaperone)  LABORATORY DATA:  I have reviewed the data as listed    Latest Ref Rng & Units 05/01/2019    8:08 AM 04/23/2019    9:05 AM 02/11/2019    8:33 AM  CMP  Glucose 65 - 99 mg/dL 142  107  104   BUN 8 - 27 mg/dL '18  17  13   '$ Creatinine 0.57 - 1.00 mg/dL 0.86  0.78  0.74   Sodium  134 - 144 mmol/L 139  141  139   Potassium 3.5 - 5.2 mmol/L 4.8  4.7  4.0   Chloride 96 - 106 mmol/L 100  102  102   CO2 20 - 29 mmol/L '26  24  28   '$ Calcium 8.7 - 10.3 mg/dL 9.8  9.8  9.6     No results found for: "WBC", "HGB", "HCT", "MCV", "PLT", "NEUTROABS"  ASSESSMENT & PLAN:  Lobular carcinoma in situ (LCIS) of left breast Left lumpectomy with Dr. [name] on 02/14/19 for which pathology showed LCIS, 0.7cm, with no evidence of invasive carcinoma. cumulative risk lifetime would be around 23.4% based upon Tyer Cusick model    Risk reduction: Tamoxifen 10 mg daily started 03/20/2019 discontinued November 2020 because of severe vaginal yeast infection and frequent  UTIs. Current treatment: Surveillance   Breast cancer surveillance: Mammogram 10/24/2021: at [facility], Benign Density C Breast MRI 05/20/2021: stable 8 mm linear NME Breast Exam 07/03/2022: Benign    Her next MRI will be in December 2024 this is being done every other year. Return to clinic in 1 year for follow-up    Orders Placed This Encounter  Procedures   MR BREAST BILATERAL [place] CAD    Standing Status:   Future    Standing Expiration Date:   07/04/2023    Order Specific Question:   If indicated for the ordered procedure, I authorize the administration of contrast media per Radiology protocol    Answer:   Yes    Order Specific Question:   What is the patient's sedation requirement?    Answer:   No Sedation    Order Specific Question:   Does the patient have a pacemaker or implanted devices?    Answer:   No    Order Specific Question:   Preferred imaging location?    Answer:   [facility] (table limit-550lbs)   The patient has a good understanding of the overall plan. she agrees with it. she will call with any problems that may develop before the next visit here. Total time spent: 30 mins including face to face time and time spent for planning, charting and co-ordination of care   Harriette Ohara, MD 07/03/22    I Gardiner Coins am acting as a Education administrator for [employer]  I have reviewed the above documentation for accuracy and completeness, and I agree with the above.

## 2022-07-03 NOTE — Assessment & Plan Note (Addendum)
Left lumpectomy with Dr. [name] on 02/14/19 for which pathology showed LCIS, 0.7cm, with no evidence of invasive carcinoma. cumulative risk lifetime would be around 23.4% based upon Tyer Cusick model    Risk reduction: Tamoxifen 10 mg daily started 03/20/2019 discontinued November 2020 because of severe vaginal yeast infection and frequent UTIs. Current treatment: Surveillance   Breast cancer surveillance: Mammogram 10/24/2021: at [facility], Benign Density C Breast MRI 05/20/2021: stable 8 mm linear NME Breast Exam 07/03/2022: Benign    Her next MRI will be in December 2024 this is being done every other year. Return to clinic in 1 year for follow-up

## 2022-07-04 ENCOUNTER — Telehealth: Payer: Self-pay | Admitting: Hematology and Oncology

## 2022-07-04 NOTE — Telephone Encounter (Signed)
Scheduled appointment per 1/22 los. Patient is aware of the made appointment.

## 2022-07-19 ENCOUNTER — Ambulatory Visit
Admission: RE | Admit: 2022-07-19 | Discharge: 2022-07-19 | Disposition: A | Discharge status: Home / Self Care | Payer: Medicare Other | Source: Ambulatory Visit | Attending: Hematology and Oncology | Admitting: Hematology and Oncology

## 2022-07-19 ENCOUNTER — Telehealth: Payer: Self-pay | Admitting: Hematology and Oncology

## 2022-07-19 DIAGNOSIS — D0502 Lobular carcinoma in situ of left breast: Secondary | ICD-10-CM

## 2022-07-19 DIAGNOSIS — Z1239 Encounter for other screening for malignant neoplasm of breast: Secondary | ICD-10-CM | POA: Diagnosis not present

## 2022-07-19 MED ORDER — GADOPICLENOL 0.5 MMOL/ML IV SOLN
7.0000 mL | Freq: Once | INTRAVENOUS | Status: AC | PRN
  Administered 2022-07-19: 7 mL via INTRAVENOUS

## 2022-07-19 NOTE — Telephone Encounter (Signed)
MRI Breast: Normal I called the patient and informed her of the test results.

## 2022-10-30 DIAGNOSIS — Z1231 Encounter for screening mammogram for malignant neoplasm of breast: Secondary | ICD-10-CM | POA: Diagnosis not present

## 2022-10-31 ENCOUNTER — Encounter: Payer: Self-pay | Admitting: Hematology and Oncology

## 2022-11-01 ENCOUNTER — Encounter: Payer: Self-pay | Admitting: Hematology and Oncology

## 2022-11-09 DIAGNOSIS — I10 Essential (primary) hypertension: Secondary | ICD-10-CM | POA: Diagnosis not present

## 2022-11-09 DIAGNOSIS — E039 Hypothyroidism, unspecified: Secondary | ICD-10-CM | POA: Diagnosis not present

## 2022-11-09 DIAGNOSIS — E7800 Pure hypercholesterolemia, unspecified: Secondary | ICD-10-CM | POA: Diagnosis not present

## 2022-11-09 DIAGNOSIS — R7301 Impaired fasting glucose: Secondary | ICD-10-CM | POA: Diagnosis not present

## 2022-11-09 DIAGNOSIS — E559 Vitamin D deficiency, unspecified: Secondary | ICD-10-CM | POA: Diagnosis not present

## 2022-11-16 DIAGNOSIS — Z8781 Personal history of (healed) traumatic fracture: Secondary | ICD-10-CM | POA: Diagnosis not present

## 2022-11-16 DIAGNOSIS — E039 Hypothyroidism, unspecified: Secondary | ICD-10-CM | POA: Diagnosis not present

## 2022-11-16 DIAGNOSIS — Z23 Encounter for immunization: Secondary | ICD-10-CM | POA: Diagnosis not present

## 2022-11-16 DIAGNOSIS — I6529 Occlusion and stenosis of unspecified carotid artery: Secondary | ICD-10-CM | POA: Diagnosis not present

## 2022-11-16 DIAGNOSIS — E7800 Pure hypercholesterolemia, unspecified: Secondary | ICD-10-CM | POA: Diagnosis not present

## 2022-11-16 DIAGNOSIS — R7303 Prediabetes: Secondary | ICD-10-CM | POA: Diagnosis not present

## 2022-11-16 DIAGNOSIS — M810 Age-related osteoporosis without current pathological fracture: Secondary | ICD-10-CM | POA: Diagnosis not present

## 2022-11-16 DIAGNOSIS — I10 Essential (primary) hypertension: Secondary | ICD-10-CM | POA: Diagnosis not present

## 2022-11-16 DIAGNOSIS — Z0000 Encounter for general adult medical examination without abnormal findings: Secondary | ICD-10-CM | POA: Diagnosis not present

## 2023-01-02 ENCOUNTER — Other Ambulatory Visit (HOSPITAL_COMMUNITY): Payer: Self-pay | Admitting: Registered Nurse

## 2023-01-02 DIAGNOSIS — I6529 Occlusion and stenosis of unspecified carotid artery: Secondary | ICD-10-CM

## 2023-01-08 ENCOUNTER — Ambulatory Visit (HOSPITAL_COMMUNITY)
Admission: RE | Admit: 2023-01-08 | Discharge: 2023-01-08 | Disposition: A | Discharge status: Home / Self Care | Payer: Medicare Other | Source: Ambulatory Visit | Attending: Registered Nurse | Admitting: Registered Nurse

## 2023-01-08 DIAGNOSIS — I6529 Occlusion and stenosis of unspecified carotid artery: Secondary | ICD-10-CM | POA: Diagnosis not present

## 2023-02-01 DIAGNOSIS — M810 Age-related osteoporosis without current pathological fracture: Secondary | ICD-10-CM | POA: Diagnosis not present

## 2023-02-01 DIAGNOSIS — E559 Vitamin D deficiency, unspecified: Secondary | ICD-10-CM | POA: Diagnosis not present

## 2023-02-01 DIAGNOSIS — I10 Essential (primary) hypertension: Secondary | ICD-10-CM | POA: Diagnosis not present

## 2023-03-27 DIAGNOSIS — D225 Melanocytic nevi of trunk: Secondary | ICD-10-CM | POA: Diagnosis not present

## 2023-03-27 DIAGNOSIS — Z85828 Personal history of other malignant neoplasm of skin: Secondary | ICD-10-CM | POA: Diagnosis not present

## 2023-03-27 DIAGNOSIS — L570 Actinic keratosis: Secondary | ICD-10-CM | POA: Diagnosis not present

## 2023-03-27 DIAGNOSIS — L814 Other melanin hyperpigmentation: Secondary | ICD-10-CM | POA: Diagnosis not present

## 2023-03-27 DIAGNOSIS — D1801 Hemangioma of skin and subcutaneous tissue: Secondary | ICD-10-CM | POA: Diagnosis not present

## 2023-03-27 DIAGNOSIS — L821 Other seborrheic keratosis: Secondary | ICD-10-CM | POA: Diagnosis not present

## 2023-03-30 DIAGNOSIS — Z23 Encounter for immunization: Secondary | ICD-10-CM | POA: Diagnosis not present

## 2023-04-30 DIAGNOSIS — N3020 Other chronic cystitis without hematuria: Secondary | ICD-10-CM | POA: Diagnosis not present

## 2023-05-22 IMAGING — DX DG SHOULDER 2+V*R*
3 series · 3 of 3 positions shown · non-contrast
Comparison: None Available.

CLINICAL DATA: Right deltoid atrophy for the past 3 months.

EXAM:
RIGHT SHOULDER - 2+ VIEW

[shoulder ap (1 of 2)]
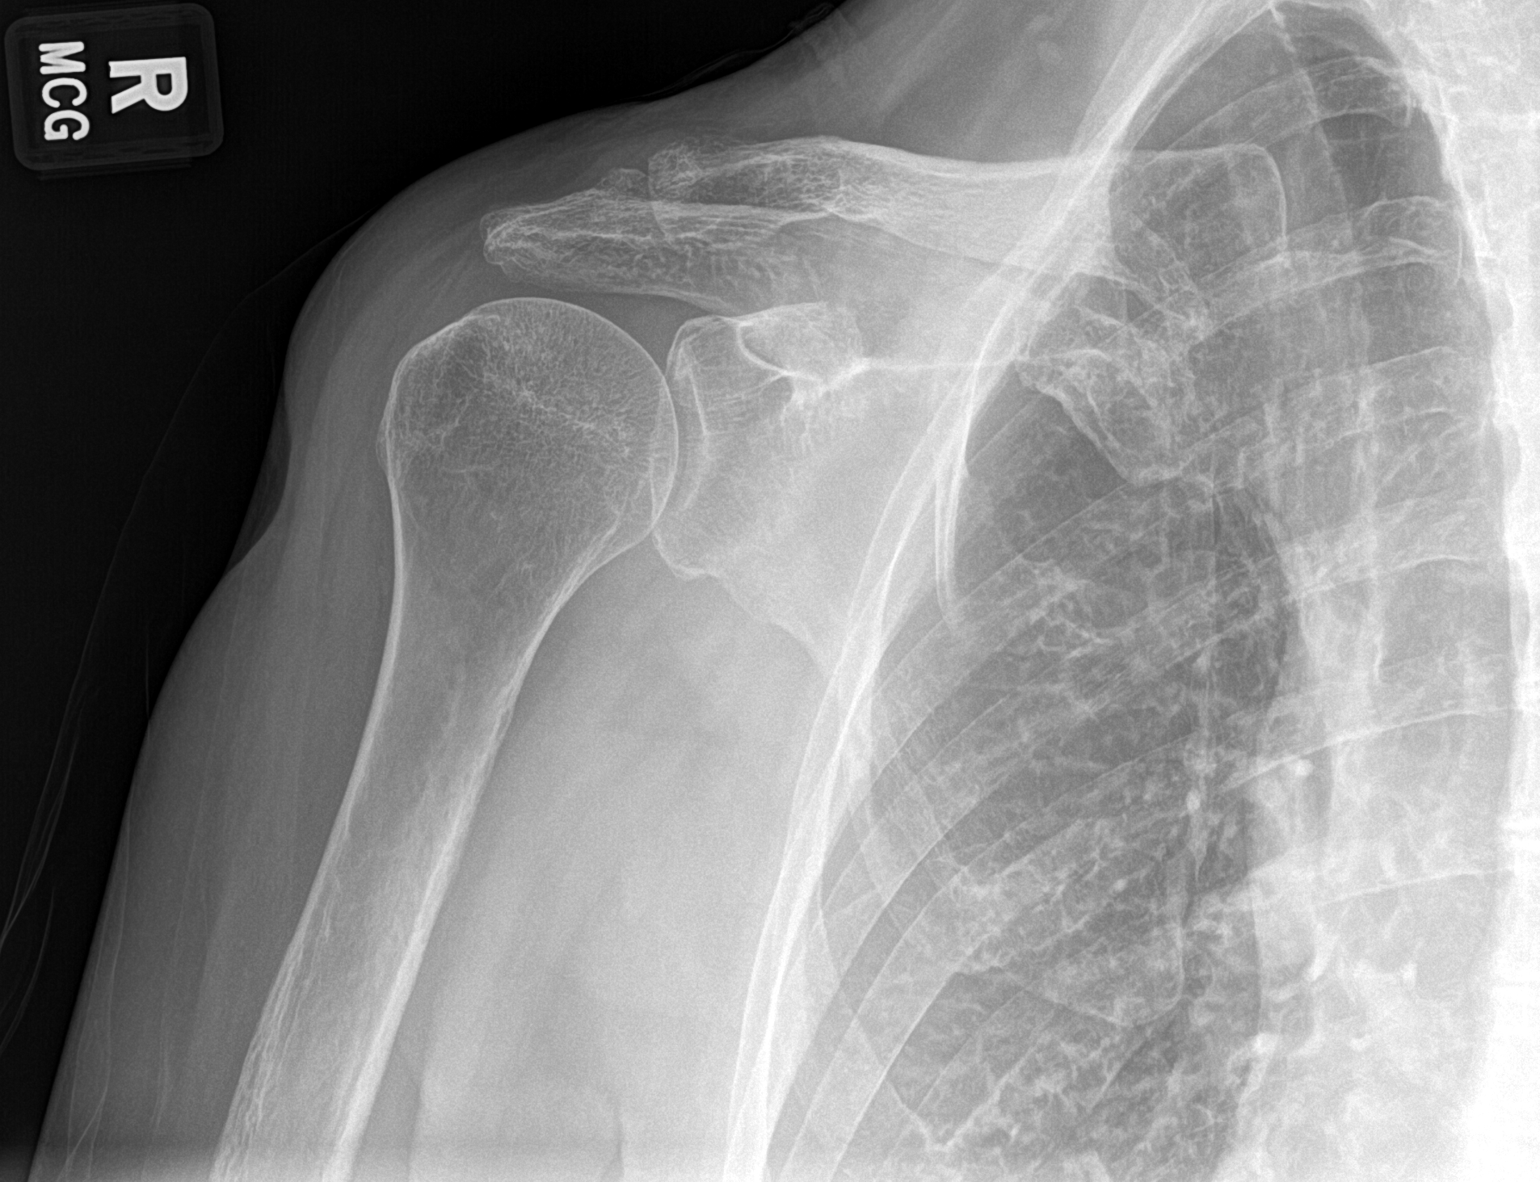

[shoulder ap (2 of 2)]
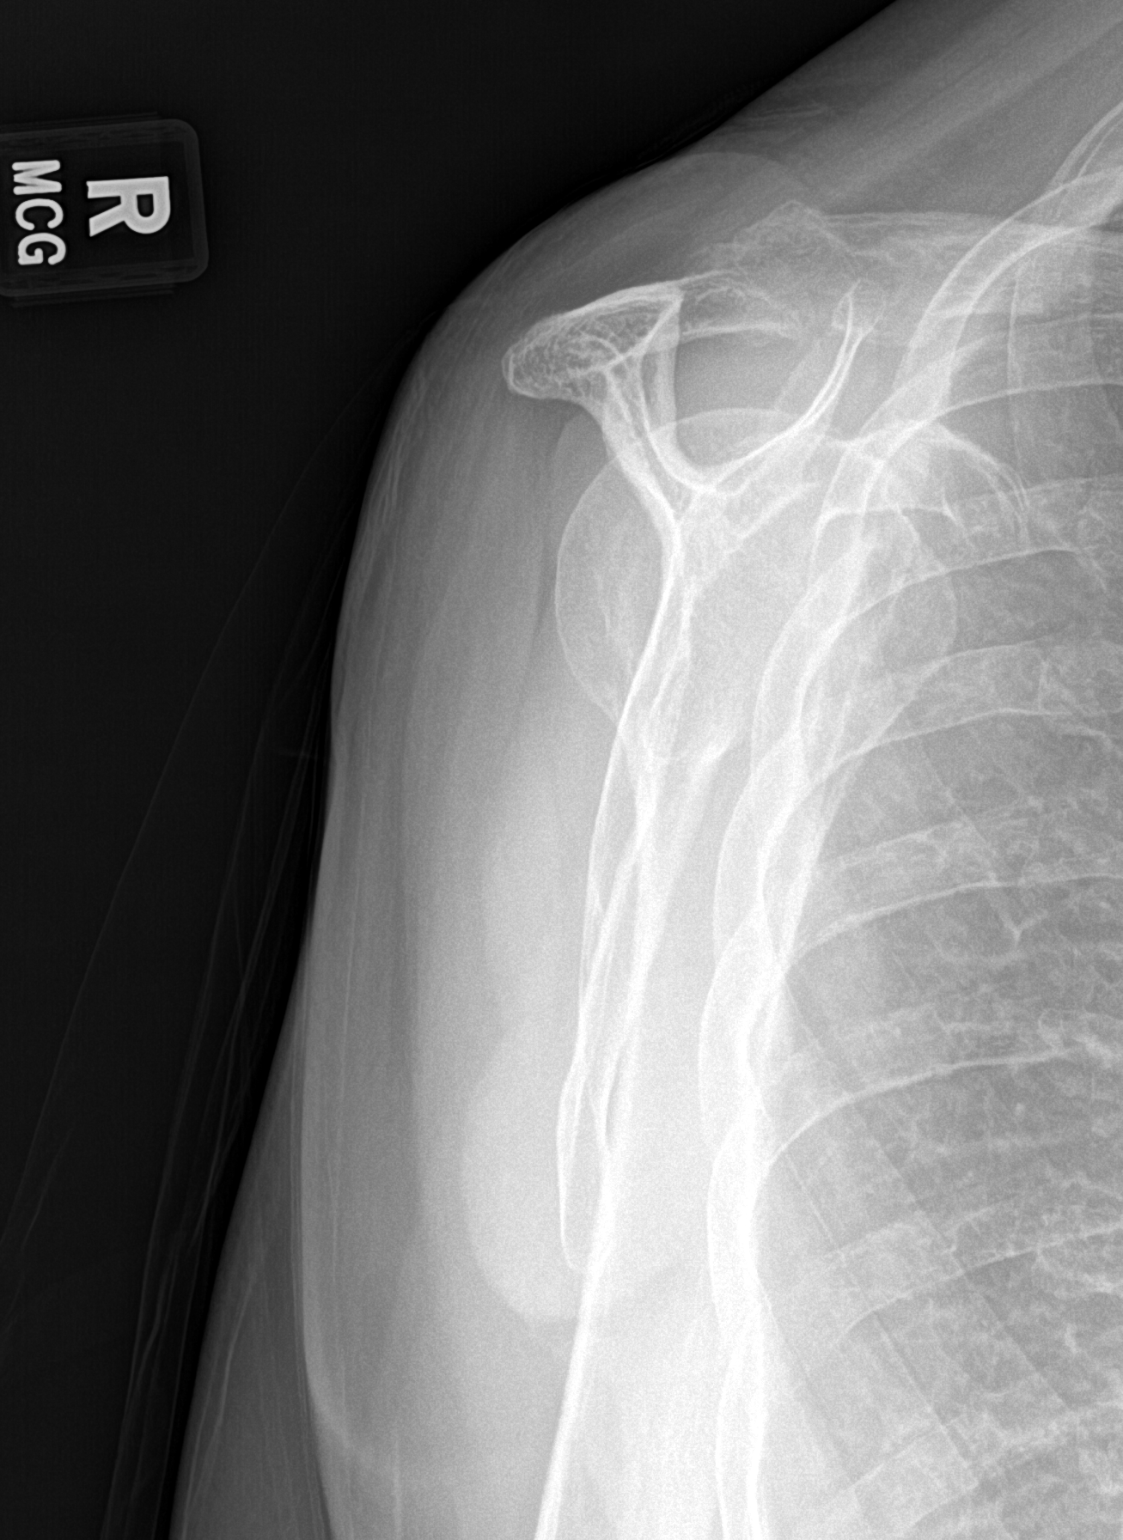

[shoulder axial]
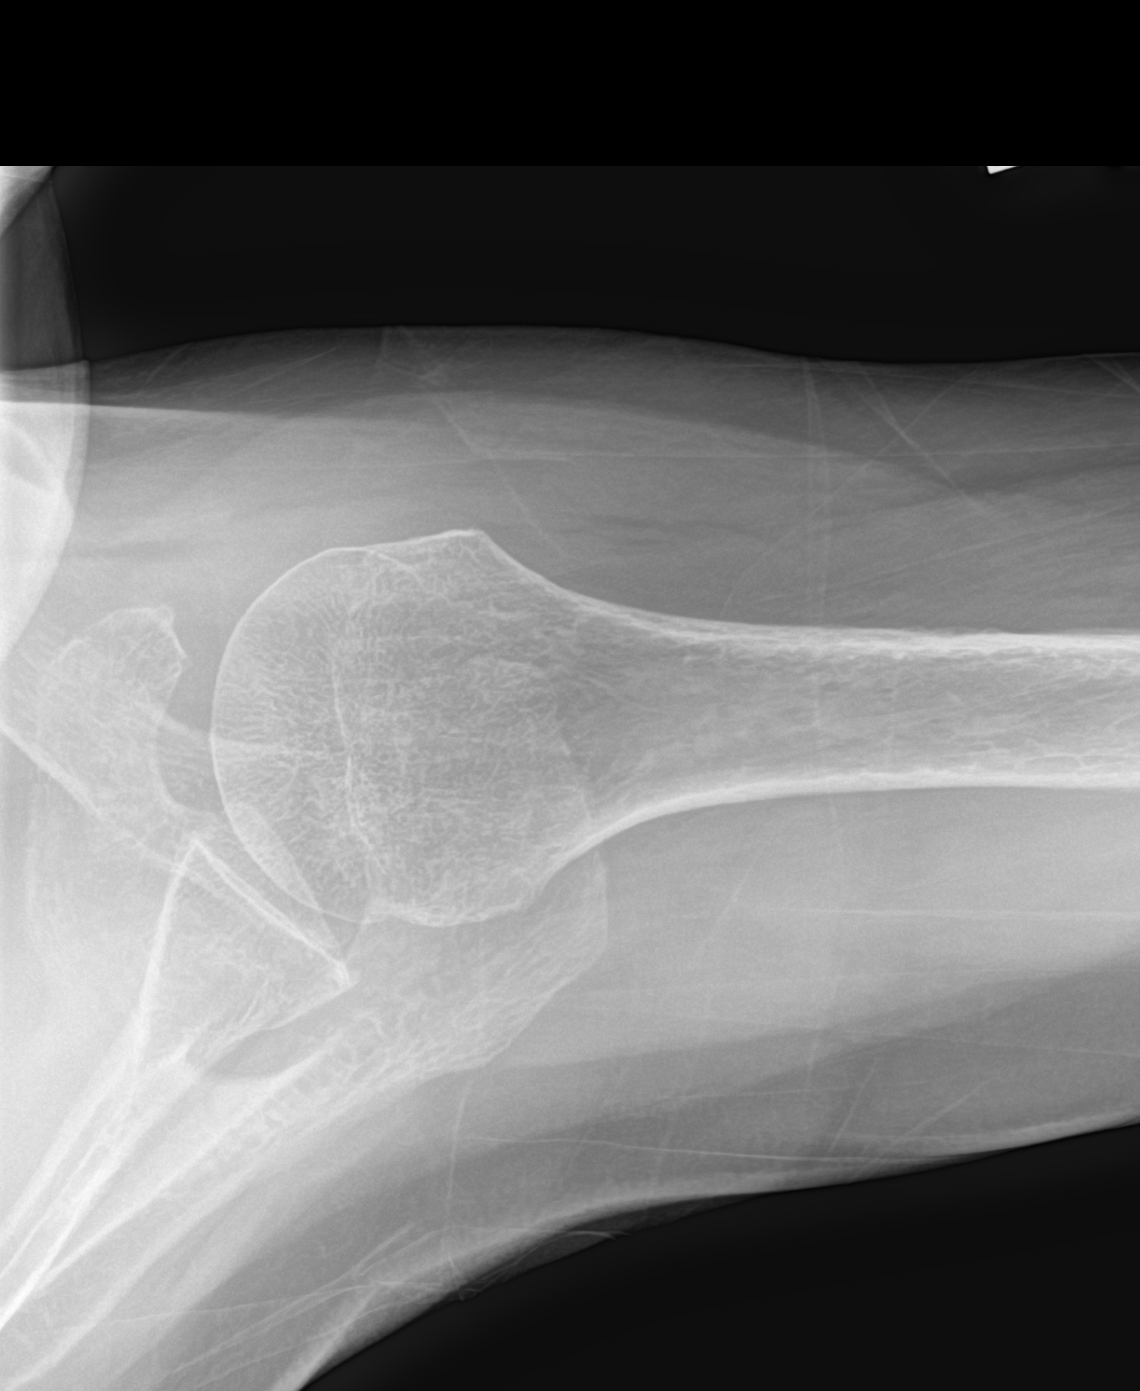

[3 of 3 positions shown; findings below may reference images not displayed]

FINDINGS: Minimal inferior glenohumeral spur formation. Otherwise,
unremarkable bones and soft tissues.
IMPRESSION: Minimal glenohumeral degenerative changes.

## 2023-05-22 IMAGING — DX DG CERVICAL SPINE 2 OR 3 VIEWS
3 series · 3 of 3 positions shown · non-contrast
Comparison: None.

CLINICAL DATA: Right deltoid atrophy for the past 3 months.

EXAM:
CERVICAL SPINE - 2-3 VIEW

[c-spine lat]
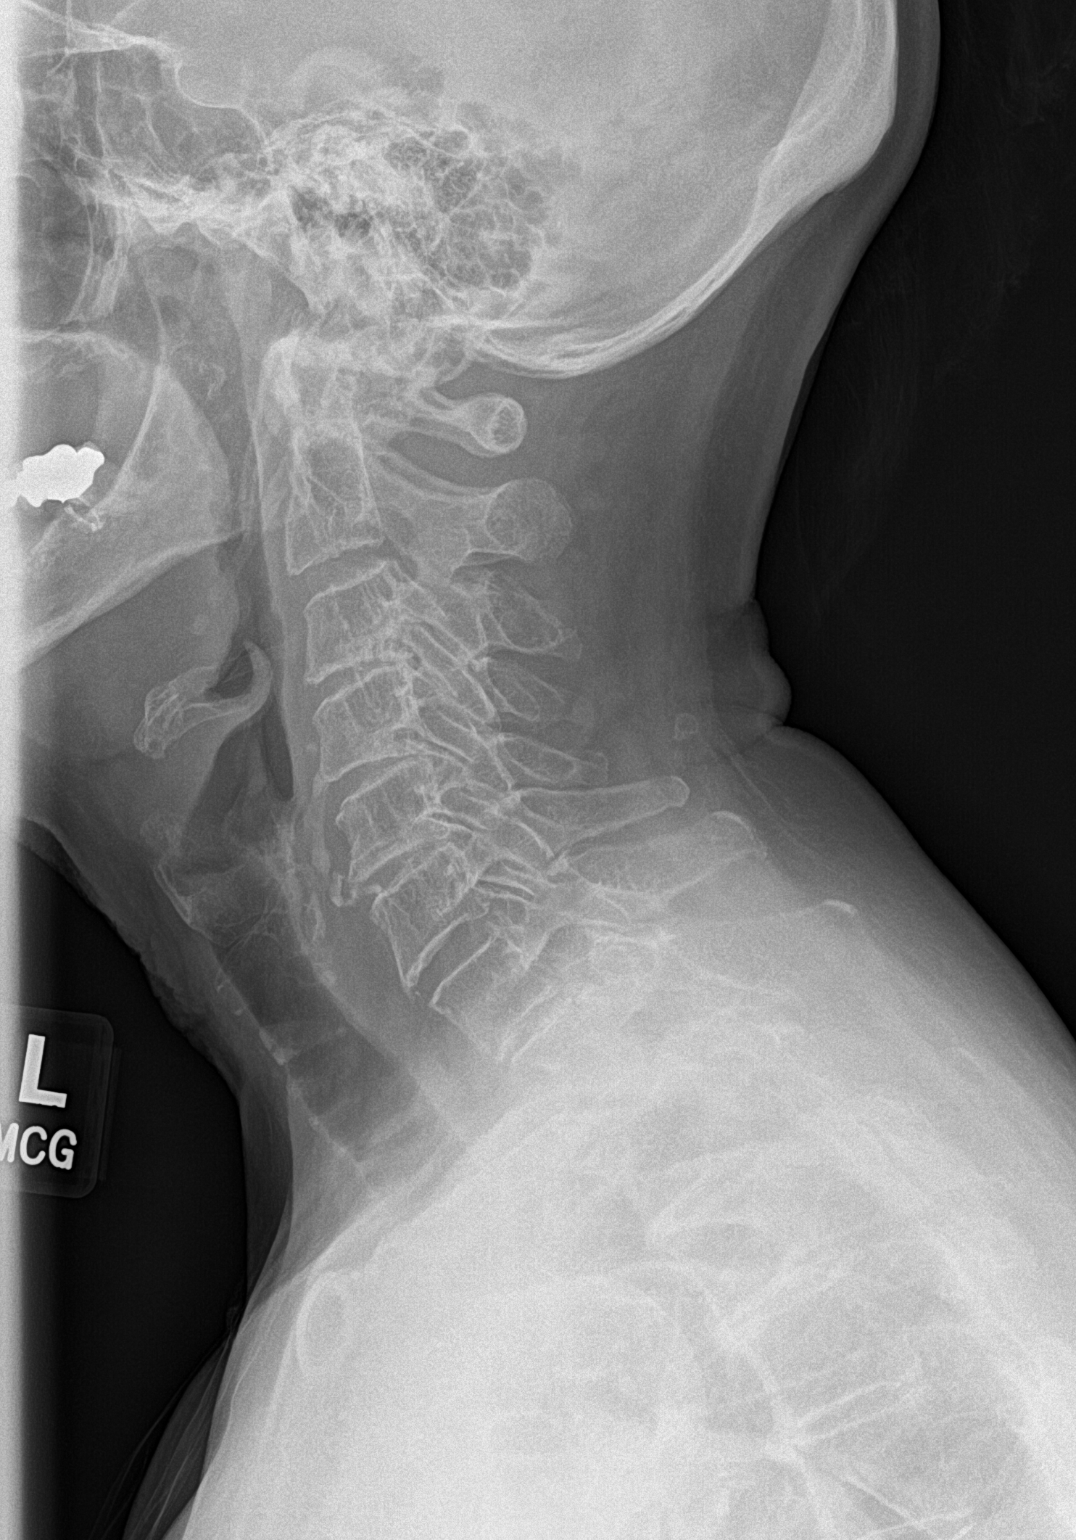

[c-spine ap]
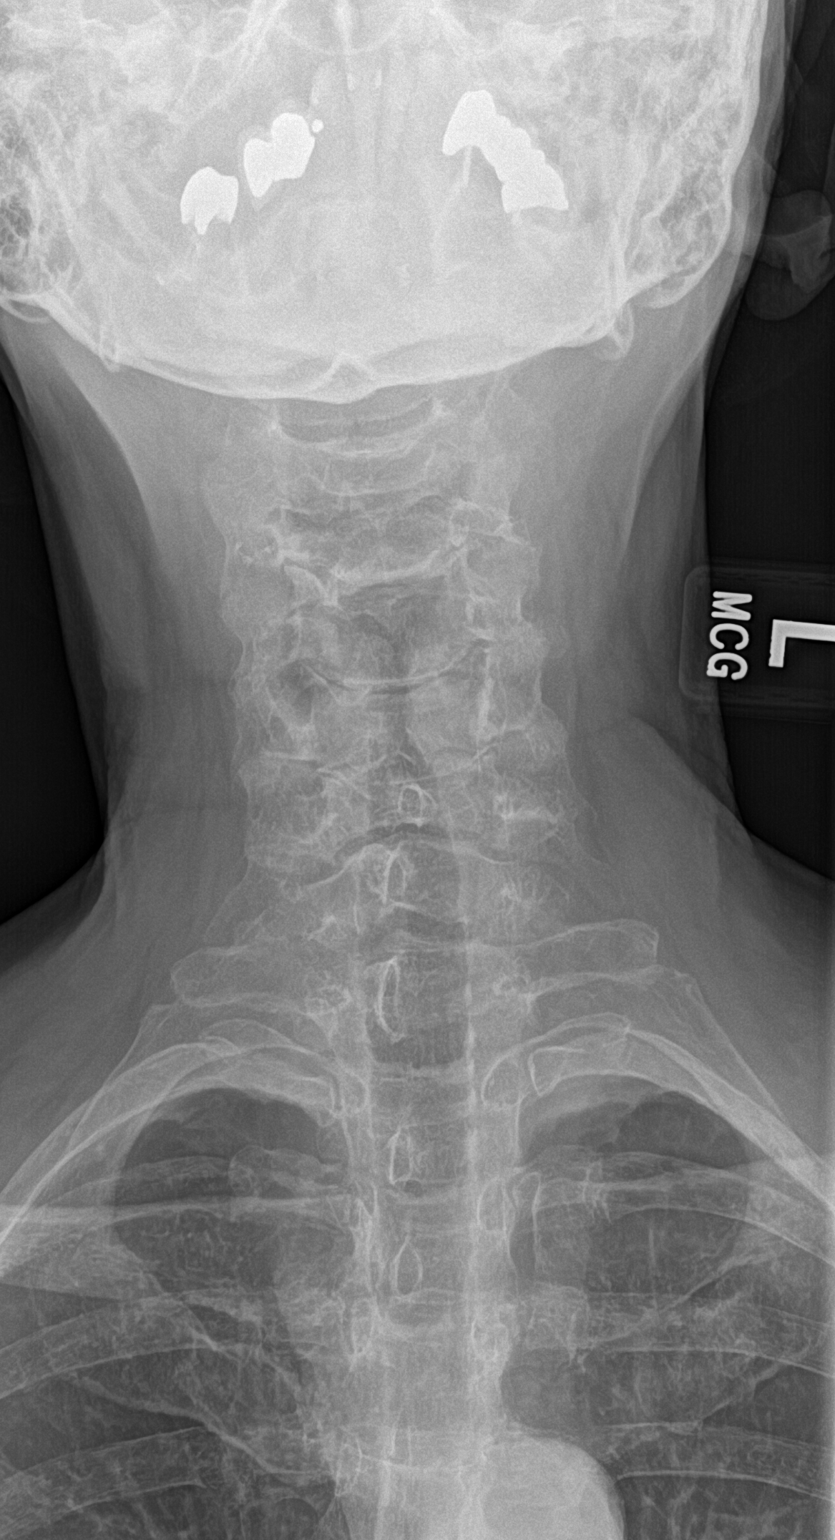

[c-spine open mouth]
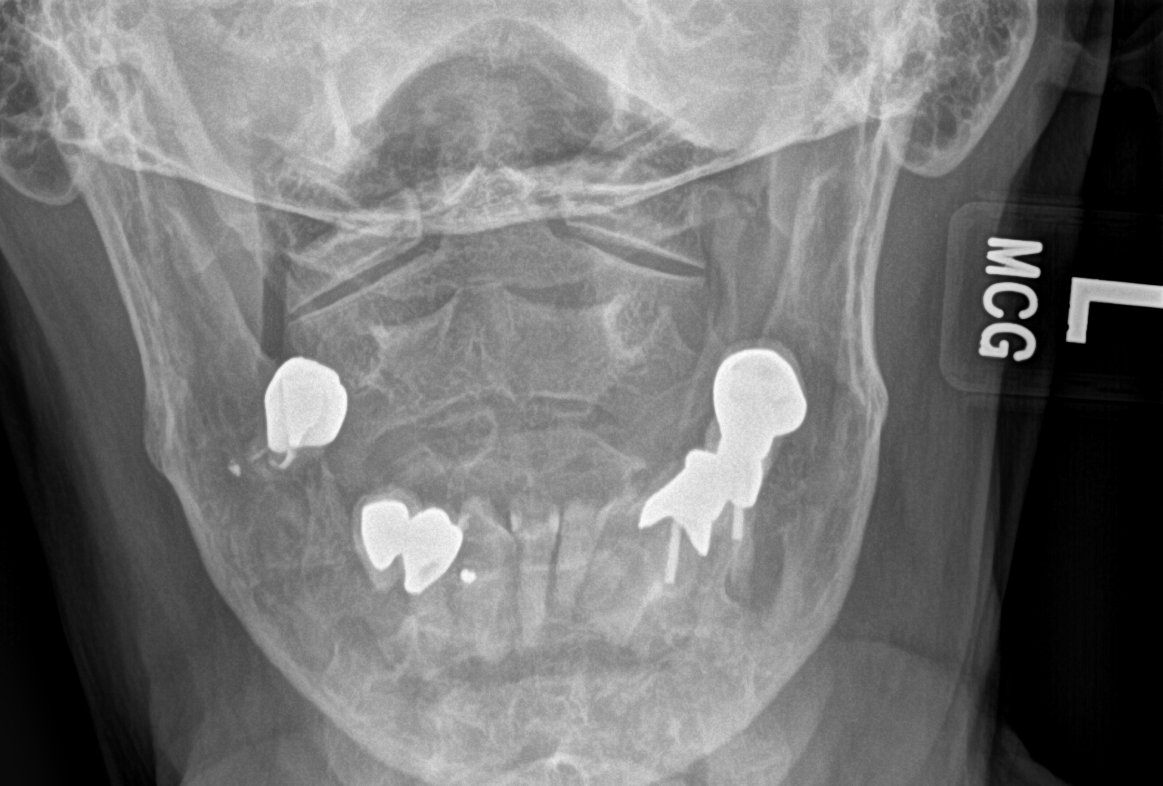

[3 of 3 positions shown; findings below may reference images not displayed]

FINDINGS: Mild anterior spur formation and moderate disc space narrowing at
multiple levels. Minimal posterior spur formation at the C3-4 level.
There are no oblique images for assessing the neural foramina.
Normal alignment.
IMPRESSION: Multilevel degenerative changes.

## 2023-05-24 DIAGNOSIS — R7303 Prediabetes: Secondary | ICD-10-CM | POA: Diagnosis not present

## 2023-05-31 DIAGNOSIS — J019 Acute sinusitis, unspecified: Secondary | ICD-10-CM | POA: Diagnosis not present

## 2023-05-31 DIAGNOSIS — R7303 Prediabetes: Secondary | ICD-10-CM | POA: Diagnosis not present

## 2023-05-31 DIAGNOSIS — E039 Hypothyroidism, unspecified: Secondary | ICD-10-CM | POA: Diagnosis not present

## 2023-05-31 DIAGNOSIS — E7800 Pure hypercholesterolemia, unspecified: Secondary | ICD-10-CM | POA: Diagnosis not present

## 2023-05-31 DIAGNOSIS — I10 Essential (primary) hypertension: Secondary | ICD-10-CM | POA: Diagnosis not present

## 2023-05-31 DIAGNOSIS — K219 Gastro-esophageal reflux disease without esophagitis: Secondary | ICD-10-CM | POA: Diagnosis not present

## 2023-06-25 DIAGNOSIS — D485 Neoplasm of uncertain behavior of skin: Secondary | ICD-10-CM | POA: Diagnosis not present

## 2023-06-25 DIAGNOSIS — H61002 Unspecified perichondritis of left external ear: Secondary | ICD-10-CM | POA: Diagnosis not present

## 2023-07-04 ENCOUNTER — Ambulatory Visit: Payer: Medicare Other | Admitting: Hematology and Oncology

## 2023-07-16 NOTE — Assessment & Plan Note (Signed)
Left lumpectomy with Dr. Ezzard Standing on 02/14/19 for which pathology showed LCIS, 0.7cm, with no evidence of invasive carcinoma. cumulative risk lifetime would be around 23.4% based upon Tyer Cusick model    Risk reduction: Tamoxifen 10 mg daily started 03/20/2019 discontinued November 2020 because of severe vaginal yeast infection and frequent UTIs. Current treatment: Surveillance   Breast cancer surveillance: Mammogram 10/30/2022: at [facility], Benign Density C Breast MRI 07/19/2022: No MRI evidence of breast malignancy Breast Exam 07/16/2023: Benign    Her next MRI will be in December 2026 this is being done every other year. Return to clinic in 1 year for follow-up

## 2023-07-17 ENCOUNTER — Inpatient Hospital Stay: Payer: Medicare Other | Attending: Hematology and Oncology | Admitting: Hematology and Oncology

## 2023-07-17 VITALS — BP 142/52 | HR 61 | Temp 98.1°F | Resp 19 | Ht 66.0 in | Wt 151.0 lb

## 2023-07-17 DIAGNOSIS — Z79810 Long term (current) use of selective estrogen receptor modulators (SERMs): Secondary | ICD-10-CM | POA: Diagnosis not present

## 2023-07-17 DIAGNOSIS — D0502 Lobular carcinoma in situ of left breast: Secondary | ICD-10-CM | POA: Diagnosis not present

## 2023-07-17 NOTE — Progress Notes (Signed)
 Patient Care Team: Clarice Nottingham, MD as PCP - General (Internal Medicine) Ardeen Rollene Kass, MD as Consulting Physician (Radiology)  DIAGNOSIS:  Encounter Diagnosis  Name Primary?   Lobular carcinoma in situ (LCIS) of left breast Yes    SUMMARY OF ONCOLOGIC HISTORY: Oncology History  Lobular carcinoma in situ (LCIS) of left breast  11/18/2018 Initial Diagnosis   Biopsy on 11/18/18 showed LCIS with calcifications. Breast MRI on 01/14/19 showed a single area of non-mass enhancement in the later left breast.    02/14/2019 Surgery   Left lumpectomy Jeoffrey): LCIS, 0.7cm, with no evidence of invasive carcinoma.   03/20/2019 -  Anti-estrogen oral therapy   Tamoxifen  10mg  daily, stopped because of frequent UTIs     CHIEF COMPLIANT: Surveillance of breast cancer  HISTORY OF PRESENT ILLNESS:  History of Present Illness   Faith Mcmahon is a 83 year old female with breast cancer who presents for a routine follow-up and discussion of imaging options.  She is attending her routine follow-up appointment, marking five years since her breast cancer diagnosis in 2020. Her health has been stable over the past year.  She has been undergoing regular mammograms and had an MRI last year. It was previously decided to skip the MRI this year and continue with biennial MRIs. A new contrast-enhanced mammogram technology has been introduced, offering MRI-quality imaging without the need for an MRI. She has no known allergy to iodine, which is used in the contrast.  She is currently taking tamoxifen .         ALLERGIES:  is allergic to penicillins, ciprofloxacin , eletriptan, nortriptyline, nsaids, other, prolia [denosumab], raloxifene, sertraline, tetanus toxoids, and tioconazole.  MEDICATIONS:  Current Outpatient Medications  Medication Sig Dispense Refill   aspirin 81 MG tablet Take 81 mg by mouth daily.     bisoprolol -hydrochlorothiazide  (ZIAC ) 5-6.25 MG tablet Take 1 tablet by mouth daily.  90 tablet 2   calcium carbonate 200 MG capsule Take 250 mg by mouth 2 (two) times daily with a meal.     cholecalciferol (VITAMIN D) 1000 UNITS tablet Take 1,000 Units by mouth daily.     CRANBERRY FRUIT PO Take 1 tablet by mouth 2 (two) times daily.     levothyroxine  (SYNTHROID, LEVOTHROID) 50 MCG tablet SAT- SUN     levothyroxine  (SYNTHROID, LEVOTHROID) 75 MCG tablet MON- FRI     Methylcellulose, Laxative, (CITRUCEL PO) Take 2 tablets by mouth daily.     Multiple Vitamin (MULTIVITAMIN) capsule Take 1 capsule by mouth daily.     omeprazole (PRILOSEC) 20 MG capsule Take 20 mg by mouth daily.     Probiotic Product (PROBIOTIC ADVANCED PO) Take 1 capsule by mouth 2 (two) times daily.     rosuvastatin  (CRESTOR ) 10 MG tablet Take 10 mg by mouth every other day.      vitamin B-12 (CYANOCOBALAMIN) 1000 MCG tablet Take 1,000 mcg by mouth daily.     No current facility-administered medications for this visit.    PHYSICAL EXAMINATION: ECOG PERFORMANCE STATUS: 1 - Symptomatic but completely ambulatory  Vitals:   07/17/23 0908  BP: (!) 142/52  Pulse: 61  Resp: 19  Temp: 98.1 F (36.7 C)  SpO2: 100%   Filed Weights   07/17/23 0908  Weight: 151 lb (68.5 kg)      LABORATORY DATA:  I have reviewed the data as listed    Latest Ref Rng & Units 05/01/2019    8:08 AM 04/23/2019    9:05 AM 02/11/2019  8:33 AM  CMP  Glucose 65 - 99 mg/dL 857  892  895   BUN 8 - 27 mg/dL 18  17  13    Creatinine 0.57 - 1.00 mg/dL 9.13  9.21  9.25   Sodium 134 - 144 mmol/L 139  141  139   Potassium 3.5 - 5.2 mmol/L 4.8  4.7  4.0   Chloride 96 - 106 mmol/L 100  102  102   CO2 20 - 29 mmol/L 26  24  28    Calcium 8.7 - 10.3 mg/dL 9.8  9.8  9.6     No results found for: WBC, HGB, HCT, MCV, PLT, NEUTROABS  ASSESSMENT & PLAN:  Lobular carcinoma in situ (LCIS) of left breast Left lumpectomy with Dr. Ethyl on 02/14/19 for which pathology showed LCIS, 0.7cm, with no evidence of invasive  carcinoma. cumulative risk lifetime would be around 23.4% based upon Tyer Cusick model    Risk reduction: Tamoxifen  10 mg daily started 03/20/2019 discontinued November 2020 because of severe vaginal yeast infection and frequent UTIs. Current treatment: Surveillance   Breast cancer surveillance: Mammogram 10/30/2022: at [facility], Benign Density C Breast MRI 07/19/2022: No MRI evidence of breast malignancy Breast Exam 07/16/2023: Benign   We discussed the role of contrast-enhanced mammograms and she is willing to do that from this year onwards.  I put in orders for May 2025 for her to do contrast-enhanced mammogram at [facility].  We will not be doing breast MRIs from this point onwards. Return to clinic in 1 year for follow-up     Orders Placed This Encounter  Procedures   MM Digital Screening    Contrast enhanced mammogram    Standing Status:   Future    Expected Date:   10/31/2023    Expiration Date:   07/16/2024    Scheduling Instructions:     Contrast enhanced mammogram    Reason for Exam (SYMPTOM  OR DIAGNOSIS REQUIRED):   High risk patient    Preferred imaging location?:   External             [facility]   The patient has a good understanding of the overall plan. she agrees with it. she will call with any problems that may develop before the next visit here. Total time spent: 30 mins including face to face time and time spent for planning, charting and co-ordination of care   Viinay K Angelly Spearing, MD 07/17/23

## 2023-11-15 DIAGNOSIS — R7303 Prediabetes: Secondary | ICD-10-CM | POA: Diagnosis not present

## 2023-11-15 DIAGNOSIS — Z0000 Encounter for general adult medical examination without abnormal findings: Secondary | ICD-10-CM | POA: Diagnosis not present

## 2023-11-15 DIAGNOSIS — E039 Hypothyroidism, unspecified: Secondary | ICD-10-CM | POA: Diagnosis not present

## 2023-11-15 DIAGNOSIS — I10 Essential (primary) hypertension: Secondary | ICD-10-CM | POA: Diagnosis not present

## 2023-11-15 DIAGNOSIS — E7800 Pure hypercholesterolemia, unspecified: Secondary | ICD-10-CM | POA: Diagnosis not present

## 2023-11-22 ENCOUNTER — Encounter: Payer: Self-pay | Admitting: Registered Nurse

## 2023-11-22 DIAGNOSIS — Z0000 Encounter for general adult medical examination without abnormal findings: Secondary | ICD-10-CM | POA: Diagnosis not present

## 2023-11-22 DIAGNOSIS — E559 Vitamin D deficiency, unspecified: Secondary | ICD-10-CM | POA: Diagnosis not present

## 2023-11-22 DIAGNOSIS — I6529 Occlusion and stenosis of unspecified carotid artery: Secondary | ICD-10-CM | POA: Diagnosis not present

## 2023-11-22 DIAGNOSIS — I10 Essential (primary) hypertension: Secondary | ICD-10-CM | POA: Diagnosis not present

## 2023-11-22 DIAGNOSIS — D0502 Lobular carcinoma in situ of left breast: Secondary | ICD-10-CM | POA: Diagnosis not present

## 2023-11-22 DIAGNOSIS — E039 Hypothyroidism, unspecified: Secondary | ICD-10-CM | POA: Diagnosis not present

## 2023-11-22 DIAGNOSIS — M810 Age-related osteoporosis without current pathological fracture: Secondary | ICD-10-CM | POA: Diagnosis not present

## 2023-11-22 DIAGNOSIS — R7303 Prediabetes: Secondary | ICD-10-CM | POA: Diagnosis not present

## 2023-11-28 DIAGNOSIS — Z9189 Other specified personal risk factors, not elsewhere classified: Secondary | ICD-10-CM | POA: Diagnosis not present

## 2023-11-28 DIAGNOSIS — N6311 Unspecified lump in the right breast, upper outer quadrant: Secondary | ICD-10-CM | POA: Diagnosis not present

## 2023-11-28 DIAGNOSIS — R92333 Mammographic heterogeneous density, bilateral breasts: Secondary | ICD-10-CM | POA: Diagnosis not present

## 2023-11-28 DIAGNOSIS — N6321 Unspecified lump in the left breast, upper outer quadrant: Secondary | ICD-10-CM | POA: Diagnosis not present

## 2023-12-03 ENCOUNTER — Encounter: Payer: Self-pay | Admitting: Hematology and Oncology

## 2024-03-04 DIAGNOSIS — L821 Other seborrheic keratosis: Secondary | ICD-10-CM | POA: Diagnosis not present

## 2024-03-04 DIAGNOSIS — D485 Neoplasm of uncertain behavior of skin: Secondary | ICD-10-CM | POA: Diagnosis not present

## 2024-03-04 DIAGNOSIS — L820 Inflamed seborrheic keratosis: Secondary | ICD-10-CM | POA: Diagnosis not present

## 2024-03-04 DIAGNOSIS — Z85828 Personal history of other malignant neoplasm of skin: Secondary | ICD-10-CM | POA: Diagnosis not present

## 2024-03-28 DIAGNOSIS — Z23 Encounter for immunization: Secondary | ICD-10-CM | POA: Diagnosis not present

## 2024-07-16 ENCOUNTER — Ambulatory Visit: Payer: Medicare Other | Admitting: Hematology and Oncology

## 2024-07-17 ENCOUNTER — Ambulatory Visit: Payer: Medicare Other | Admitting: Hematology and Oncology

## 2024-08-13 ENCOUNTER — Inpatient Hospital Stay: Admitting: Hematology and Oncology
